# Patient Record
Sex: Female | Born: 1975 | Race: White | Hispanic: No | Smoking: Former smoker
Health system: Southern US, Community
[De-identification: ages and names within clinical notes are randomized; demographics above are authoritative.]

## PROBLEM LIST (undated history)

## (undated) DIAGNOSIS — F32A Depression, unspecified: Secondary | ICD-10-CM

## (undated) DIAGNOSIS — F1990 Other psychoactive substance use, unspecified, uncomplicated: Secondary | ICD-10-CM

## (undated) DIAGNOSIS — R011 Cardiac murmur, unspecified: Secondary | ICD-10-CM

## (undated) DIAGNOSIS — F1120 Opioid dependence, uncomplicated: Secondary | ICD-10-CM

## (undated) DIAGNOSIS — F329 Major depressive disorder, single episode, unspecified: Secondary | ICD-10-CM

## (undated) DIAGNOSIS — I10 Essential (primary) hypertension: Secondary | ICD-10-CM

## (undated) HISTORY — PX: TUBAL LIGATION: SHX77

---

## 2002-03-13 ENCOUNTER — Inpatient Hospital Stay (HOSPITAL_COMMUNITY): Admission: AD | Admit: 2002-03-13 | Discharge: 2002-03-13 | Payer: Self-pay | Admitting: *Deleted

## 2002-06-25 ENCOUNTER — Inpatient Hospital Stay (HOSPITAL_COMMUNITY): Admission: AD | Admit: 2002-06-25 | Discharge: 2002-06-25 | Payer: Self-pay | Admitting: *Deleted

## 2002-09-22 ENCOUNTER — Inpatient Hospital Stay (HOSPITAL_COMMUNITY): Admission: AD | Admit: 2002-09-22 | Discharge: 2002-09-22 | Payer: Self-pay | Admitting: Obstetrics

## 2002-10-13 ENCOUNTER — Inpatient Hospital Stay (HOSPITAL_COMMUNITY): Admission: AD | Admit: 2002-10-13 | Discharge: 2002-10-15 | Payer: Self-pay | Admitting: Obstetrics

## 2003-02-14 ENCOUNTER — Ambulatory Visit (HOSPITAL_COMMUNITY): Admission: RE | Admit: 2003-02-14 | Discharge: 2003-02-14 | Payer: Self-pay | Admitting: Gastroenterology

## 2003-02-20 ENCOUNTER — Ambulatory Visit (HOSPITAL_COMMUNITY): Admission: RE | Admit: 2003-02-20 | Discharge: 2003-02-20 | Payer: Self-pay | Admitting: Gastroenterology

## 2003-02-20 ENCOUNTER — Encounter: Payer: Self-pay | Admitting: Gastroenterology

## 2004-06-09 ENCOUNTER — Ambulatory Visit (HOSPITAL_COMMUNITY): Admission: RE | Admit: 2004-06-09 | Discharge: 2004-06-09 | Payer: Self-pay | Admitting: Obstetrics

## 2006-08-28 ENCOUNTER — Inpatient Hospital Stay (HOSPITAL_COMMUNITY): Admission: AD | Admit: 2006-08-28 | Discharge: 2006-08-31 | Payer: Self-pay | Admitting: Obstetrics

## 2006-12-16 ENCOUNTER — Ambulatory Visit (HOSPITAL_COMMUNITY): Admission: RE | Admit: 2006-12-16 | Discharge: 2006-12-16 | Payer: Self-pay | Admitting: Obstetrics

## 2009-10-21 ENCOUNTER — Emergency Department (HOSPITAL_BASED_OUTPATIENT_CLINIC_OR_DEPARTMENT_OTHER): Admission: EM | Admit: 2009-10-21 | Discharge: 2009-10-21 | Payer: Self-pay | Admitting: Emergency Medicine

## 2010-07-15 ENCOUNTER — Emergency Department (HOSPITAL_BASED_OUTPATIENT_CLINIC_OR_DEPARTMENT_OTHER): Admission: EM | Admit: 2010-07-15 | Discharge: 2010-07-15 | Payer: Self-pay | Admitting: Emergency Medicine

## 2011-03-27 NOTE — Op Note (Signed)
Margaret [name], Margaret [name]          ACCOUNT NO.:  [id number]   MEDICAL RECORD NO.:  [id number]          PATIENT TYPE:  AMB   LOCATION:  SDC                           FACILITY:  [facility]   PHYSICIAN:  Kathreen Cosier, M.D.DATE OF BIRTH:  1976/08/31   DATE OF PROCEDURE:  12/16/2006  DATE OF DISCHARGE:                               OPERATIVE REPORT   PREOPERATIVE DIAGNOSIS:  Multiparity.   PROCEDURE:  Laparoscopic tubal sterilization.   Under general anesthesia, with the patient in the lithotomy position,  abdomen prepped and the vagina prepped and draped and the bladder  emptied with a straight catheter.  A weighted speculum was placed in the  vagina.  The cervix was grasped with a Hulka tenaculum.  In the  umbilicus, a transverse incision was made and carried down to fascia.  The fascia was grasped with two Kochers, and the fascia and peritoneum  opened with Mayo scissors.  The tip of the trocar inserted  intraperitoneally.  Three liters of carbon dioxide was infused  intraperitoneally.  The scope was inserted, and the uterus, tubes and  ovaries were normal.  Cautery probe inserted and the right tube grasped  1 inch from the cornu and cauterized.  Tube cauterized in a total of  four places, moving lateral to the first site of cautery.  Proceeded  doing this in a similar fashion on the other side.  Portals were removed  from the abdomen, CO2 allowed to escape from the peritoneal cavity.  Fascia closed with one stitch of 0 Dexon.  The skin closed with  subcuticular stitch of 4-0 chromic.  The patient tolerated the procedure  well, taken to the recovery room in good condition.           ______________________________  Kathreen Cosier, M.D.     BAM/MEDQ  D:  12/16/2006  T:  12/16/2006  Job:  [id number]

## 2011-04-01 ENCOUNTER — Emergency Department (HOSPITAL_COMMUNITY)
Admission: EM | Admit: 2011-04-01 | Discharge: 2011-04-02 | Disposition: A | Discharge status: Other Acute Inpatient Hospital | Payer: Self-pay | Attending: Emergency Medicine | Admitting: Emergency Medicine

## 2011-04-01 DIAGNOSIS — F1910 Other psychoactive substance abuse, uncomplicated: Secondary | ICD-10-CM | POA: Insufficient documentation

## 2011-04-01 DIAGNOSIS — F1410 Cocaine abuse, uncomplicated: Secondary | ICD-10-CM | POA: Insufficient documentation

## 2011-04-01 DIAGNOSIS — F1210 Cannabis abuse, uncomplicated: Secondary | ICD-10-CM | POA: Insufficient documentation

## 2011-04-01 LAB — COMPREHENSIVE METABOLIC PANEL
ALT: 26 U/L (ref 0–35)
AST: 24 U/L (ref 0–37)
Albumin: 4.3 g/dL (ref 3.5–5.2)
Alkaline Phosphatase: 48 U/L (ref 39–117)
BUN: 10 mg/dL (ref 6–23)
CO2: 29 mEq/L (ref 19–32)
Calcium: 9 mg/dL (ref 8.4–10.5)
Chloride: 105 mEq/L (ref 96–112)
Creatinine, Ser: 0.7 mg/dL (ref 0.4–1.2)
GFR calc Af Amer: >60 mL/min (ref >60)
GFR calc non Af Amer: >60 mL/min (ref >60)
Glucose, Bld: 92 mg/dL (ref 70–99)
Potassium: 3.9 mEq/L (ref 3.5–5.1)
Sodium: 140 mEq/L (ref 135–145)
Total Bilirubin: 0.3 mg/dL (ref 0.3–1.2)
Total Protein: 6.6 g/dL (ref 6.0–8.3)

## 2011-04-01 LAB — DIFFERENTIAL
Basophils Absolute: 0.1 10*3/uL (ref 0.0–0.1)
Basophils Relative: 1 % (ref 0–1)
Eosinophils Absolute: 0.1 10*3/uL (ref 0.0–0.7)
Eosinophils Relative: 2 % (ref 0–5)
Lymphocytes Relative: 60 % — ABNORMAL HIGH (ref 12–46)
Lymphs Abs: 2.7 10*3/uL (ref 0.7–4.0)
Monocytes Absolute: 0.2 10*3/uL (ref 0.1–1.0)
Monocytes Relative: 5 % (ref 3–12)
Neutro Abs: 1.4 10*3/uL — ABNORMAL LOW (ref 1.7–7.7)
Neutrophils Relative %: 32 % — ABNORMAL LOW (ref 43–77)

## 2011-04-01 LAB — CBC
HCT: 39.1 % (ref 36.0–46.0)
Hemoglobin: 13 g/dL (ref 12.0–15.0)
MCH: 30.8 pg (ref 26.0–34.0)
MCHC: 33.2 g/dL (ref 30.0–36.0)
MCV: 92.7 fL (ref 78.0–100.0)
Platelets: 141 10*3/uL — ABNORMAL LOW (ref 150–400)
RBC: 4.22 MIL/uL (ref 3.87–5.11)
RDW: 11.7 % (ref 11.5–15.5)
WBC: 4.5 10*3/uL (ref 4.0–10.5)

## 2011-04-01 LAB — ETHANOL: Alcohol, Ethyl (B): <11 mg/dL — ABNORMAL HIGH (ref 0–10)

## 2011-04-01 LAB — PREGNANCY, URINE: Preg Test, Ur: NEGATIVE

## 2011-04-01 LAB — RAPID URINE DRUG SCREEN, HOSP PERFORMED
Benzodiazepines: NOT DETECTED
Cocaine: POSITIVE — AB
Opiates: POSITIVE — AB

## 2011-04-02 ENCOUNTER — Inpatient Hospital Stay (HOSPITAL_COMMUNITY)
Admission: AD | Admit: 2011-04-02 | Discharge: 2011-04-08 | DRG: 885 | Disposition: A | Discharge status: Home / Self Care | Payer: PRIVATE HEALTH INSURANCE | Source: Ambulatory Visit | Attending: Psychiatry | Admitting: Psychiatry

## 2011-04-02 DIAGNOSIS — F1920 Other psychoactive substance dependence, uncomplicated: Secondary | ICD-10-CM

## 2011-04-02 DIAGNOSIS — F4310 Post-traumatic stress disorder, unspecified: Secondary | ICD-10-CM

## 2011-04-02 DIAGNOSIS — Z5900 Homelessness unspecified: Secondary | ICD-10-CM

## 2011-04-02 DIAGNOSIS — F331 Major depressive disorder, recurrent, moderate: Principal | ICD-10-CM

## 2011-04-02 NOTE — Consult Note (Signed)
  Margaret [name], [name]          ACCOUNT NO.:  [id number]  MEDICAL RECORD NO.:  [id number]           PATIENT TYPE:  E  LOCATION:  WLED                         FACILITY:  [facility]  PHYSICIAN:  Eulogio Ditch, MD DATE OF BIRTH:  [birth year]  DATE OF CONSULTATION:  04/02/2011 DATE OF DISCHARGE:                                CONSULTATION   REASON FOR CONSULTATION:  Drug abuse.  HISTORY OF PRESENT ILLNESS:  A 35 year old female who is currently homeless; living with the friends; abusing all kind of drugs morphine, cocaine, Xanax, and marijuana for the last many years.  The patient currently is motivated to get detox and rehab.  She denies any suicidal or homicidal ideations.  She denies hearing any voices.  CURRENT PSYCHIATRIC MEDICATIONS:  The patient is not on any psych medication.  PAST MEDICAL HISTORY:  History of heart murmur.  ALLERGIES:  No known drug allergies.  MENTAL STATUS EXAMINATION:  Calm, cooperative during the interview. Fair eye contact.  No abnormal movements noticed.  Hygiene, grooming fair.  Speech normal in rate, rhythm, and volume.  Mood, depressed. Affect mood congruent.  Thought process, logical and goal directed. Thought content, not suicidal or homicidal, not delusional.  Thought perception, no audiovisual hallucination reported.  Not internally preoccupied.  Cognition, alert, awake, oriented x3.  Memory immediate, recent, remote is fair.  Attention concentration, fair.  Abstraction ability, good.  Insight and judgment, fair.  DIAGNOSES:  Axis I:  Polysubstance dependence. Axis II:  Deferred. Axis III:  History of heart murmur. Axis IV:  Homeless, drug abuse. Axis V:  40.  RECOMMENDATIONS: 1. The patient will be started on clonidine detox protocol. 2. The patient will be referred to the inpatient detox and rehab.     Eulogio Ditch, MD    SA/MEDQ  D:  04/02/2011  T:  04/02/2011  Job:  [id number]  Electronically Signed by Eulogio Ditch  on 04/02/2011 06:45:11 PM

## 2011-04-03 DIAGNOSIS — F1920 Other psychoactive substance dependence, uncomplicated: Secondary | ICD-10-CM

## 2011-04-03 DIAGNOSIS — F339 Major depressive disorder, recurrent, unspecified: Secondary | ICD-10-CM
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
## 2011-04-26 ENCOUNTER — Emergency Department (HOSPITAL_BASED_OUTPATIENT_CLINIC_OR_DEPARTMENT_OTHER)
Admission: EM | Admit: 2011-04-26 | Discharge: 2011-04-26 | Disposition: A | Discharge status: Home / Self Care | Payer: Self-pay | Attending: Emergency Medicine | Admitting: Emergency Medicine

## 2011-04-26 DIAGNOSIS — R5381 Other malaise: Secondary | ICD-10-CM | POA: Insufficient documentation

## 2011-04-26 DIAGNOSIS — R5383 Other fatigue: Secondary | ICD-10-CM | POA: Insufficient documentation

## 2011-04-26 DIAGNOSIS — Z79899 Other long term (current) drug therapy: Secondary | ICD-10-CM | POA: Insufficient documentation

## 2011-04-26 LAB — BASIC METABOLIC PANEL
BUN: 16 mg/dL (ref 6–23)
CO2: 27 mEq/L (ref 19–32)
Calcium: 10 mg/dL (ref 8.4–10.5)
Chloride: 104 mEq/L (ref 96–112)
Creatinine, Ser: 0.7 mg/dL (ref 0.50–1.10)
Glucose, Bld: 106 mg/dL — ABNORMAL HIGH (ref 70–99)

## 2011-12-03 ENCOUNTER — Encounter (HOSPITAL_BASED_OUTPATIENT_CLINIC_OR_DEPARTMENT_OTHER): Payer: Self-pay | Admitting: *Deleted

## 2011-12-03 ENCOUNTER — Emergency Department (HOSPITAL_BASED_OUTPATIENT_CLINIC_OR_DEPARTMENT_OTHER)
Admission: EM | Admit: 2011-12-03 | Discharge: 2011-12-03 | Disposition: A | Discharge status: Home / Self Care | Payer: Self-pay | Attending: Emergency Medicine | Admitting: Emergency Medicine

## 2011-12-03 DIAGNOSIS — M6530 Trigger finger, unspecified finger: Secondary | ICD-10-CM | POA: Insufficient documentation

## 2011-12-03 DIAGNOSIS — M79609 Pain in unspecified limb: Secondary | ICD-10-CM | POA: Insufficient documentation

## 2011-12-03 MED ORDER — NAPROXEN 500 MG PO TABS: 500.0000 mg | ORAL_TABLET | Freq: Two times a day (BID) | ORAL | Status: DC

## 2011-12-03 NOTE — ED Notes (Signed)
Pt reports that during the night for the past 2 months she wakes up and both hands are "alseep and painful" states in the mornings usually 2 fingers on each hand are"swollen and stuck" in a bent position and unable to straighten them. 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 No other health problems or symptoms. States during the day sometimes her fingers will"let go and open up" pt denies cramping in calves or other areas of body

## 2011-12-03 NOTE — ED Provider Notes (Signed)
History     CSN: [id number]  Arrival date & time 12/03/11  8756   First MD Initiated Contact with Patient 12/03/11 [phone]      Chief Complaint  Patient presents with  . Hand Pain    (Consider location/radiation/quality/duration/timing/severity/associated sxs/prior treatment) HPI Comments: 2 months of intermittent bilateral finger pain. The pain affect all fingers at various times except the bilateral little fingers.  Patient states it is worse when she wakes up and while she will have several fingers fixed in a bent position.  They're painful to straighten and require force from the other hand.  There is no tingling or numbness there is pain. It seems to get better throughout the day but they're quite stiff in the mornings. She's been taking ibuprofen for the pain. 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 She denies any fever, vomiting, rash, weakness.  The history is provided by the patient.    History reviewed. No pertinent past medical history.  History reviewed. No pertinent past surgical history.  History reviewed. No pertinent family history.  History  Substance Use Topics  . Smoking status: Former Games developer  . Smokeless tobacco: Not on file  . Alcohol Use: No    OB History    Grav Para Term Preterm Abortions TAB SAB Ect Mult Living                  Review of Systems  Constitutional: Negative for fever, activity change and appetite change.  Respiratory: Negative for choking and shortness of breath.   Cardiovascular: Negative for chest pain.  Gastrointestinal: Negative for nausea and vomiting.  Genitourinary: Negative for dysuria and hematuria.  Musculoskeletal: Positive for myalgias and arthralgias.  Skin: Negative for rash.    Allergies  Review of patient's allergies indicates no known allergies.  Home Medications   Current Outpatient Rx  Name Route Sig Dispense Refill  . METHADONE HCL 10 MG PO TABS Oral Take 10 mg by mouth every  8 (eight) hours.    Marland Kitchen NAPROXEN 500 MG PO TABS Oral Take 1 tablet (500 mg total) by mouth 2 (two) times daily. 30 tablet 0    BP 143/91  Pulse 81  Temp(Src) 98.2 F (36.8 C) (Oral)  Resp 20  SpO2 100%  LMP 10/03/2011  Physical Exam  Constitutional: She is oriented to person, place, and time. She appears well-developed and well-nourished. No distress.  HENT:  Head: Normocephalic and atraumatic.  Musculoskeletal: Normal range of motion. She exhibits no edema and no tenderness.       Right fourth finger flexed at the MCP and PIP joint. Able to be straightened.  +2 radial pulses bilaterally, cardinal hand movements intact, capillary refill less than 2 seconds, no rash sensation intact. Negative Tinel's sign negative Phalen sign  Neurological: She is alert and oriented to person, place, and time. No cranial nerve deficit.  Skin: Skin is warm.    ED Course  Procedures (including critical care time)  Labs Reviewed  GLUCOSE, CAPILLARY - Abnormal; Notable for the following:    Glucose-Capillary 110 (*)    All other components within normal limits  POCT CBG MONITORING   No results found.   1. Trigger finger       MDM  Bilateral finger pain for the past 2 months with varying fingers affected.  At least one trigger finger on exam.  No weakness, numbness, tingling.  Good perfusion.    Probable multiple trigger fingers, less likely  carpal tunnel syndrome. CBG, discussed use of wrist splints at night, NSAIDs, f/u hand.        Glynn Octave, MD 12/03/11 1650

## 2011-12-28 ENCOUNTER — Encounter (HOSPITAL_COMMUNITY): Payer: Self-pay

## 2011-12-28 ENCOUNTER — Emergency Department (HOSPITAL_COMMUNITY)
Admission: EM | Admit: 2011-12-28 | Discharge: 2011-12-30 | Disposition: A | Discharge status: Home / Self Care | Payer: Self-pay | Attending: Emergency Medicine | Admitting: Emergency Medicine

## 2011-12-28 DIAGNOSIS — F3289 Other specified depressive episodes: Secondary | ICD-10-CM | POA: Insufficient documentation

## 2011-12-28 DIAGNOSIS — F329 Major depressive disorder, single episode, unspecified: Secondary | ICD-10-CM

## 2011-12-28 DIAGNOSIS — R45851 Suicidal ideations: Secondary | ICD-10-CM | POA: Insufficient documentation

## 2011-12-28 LAB — RAPID URINE DRUG SCREEN, HOSP PERFORMED
Barbiturates: NOT DETECTED
Benzodiazepines: NOT DETECTED
Cocaine: POSITIVE — AB

## 2011-12-28 LAB — COMPREHENSIVE METABOLIC PANEL
ALT: 17 U/L (ref 0–35)
BUN: 9 mg/dL (ref 6–23)
CO2: 32 mEq/L (ref 19–32)
Calcium: 9.8 mg/dL (ref 8.4–10.5)
GFR calc Af Amer: >90 mL/min (ref >90)
GFR calc non Af Amer: >90 mL/min (ref >90)
Glucose, Bld: 97 mg/dL (ref 70–99)
Total Protein: 6.7 g/dL (ref 6.0–8.3)

## 2011-12-28 LAB — CBC
HCT: 36.3 % (ref 36.0–46.0)
Hemoglobin: 12.7 g/dL (ref 12.0–15.0)
MCH: 30.8 pg (ref 26.0–34.0)
MCHC: 35 g/dL (ref 30.0–36.0)
MCV: 87.9 fL (ref 78.0–100.0)
RBC: 4.13 MIL/uL (ref 3.87–5.11)

## 2011-12-28 LAB — ETHANOL: Alcohol, Ethyl (B): <11 mg/dL (ref 0–11)

## 2011-12-28 LAB — ACETAMINOPHEN LEVEL: Acetaminophen (Tylenol), Serum: <15 ug/mL (ref 10–30)

## 2011-12-28 MED ORDER — NICOTINE 21 MG/24HR TD PT24: 21.0000 mg | MEDICATED_PATCH | Freq: Every day | TRANSDERMAL | Status: DC

## 2011-12-28 MED ORDER — LORAZEPAM 1 MG PO TABS: 1.0000 mg | ORAL_TABLET | Freq: Three times a day (TID) | ORAL | Status: DC | PRN

## 2011-12-28 MED ORDER — ONDANSETRON HCL 4 MG PO TABS: 4.0000 mg | ORAL_TABLET | Freq: Three times a day (TID) | ORAL | Status: DC | PRN

## 2011-12-28 MED ORDER — IBUPROFEN 600 MG PO TABS
600.0000 mg | ORAL_TABLET | Freq: Three times a day (TID) | ORAL | Status: DC | PRN
  Administered 2011-12-29: 600 mg via ORAL
  Filled 2011-12-28: qty 1

## 2011-12-28 MED ORDER — BUPROPION HCL ER (XL) 150 MG PO TB24
150.0000 mg | ORAL_TABLET | Freq: Every day | ORAL | Status: DC
  Administered 2011-12-28 – 2011-12-29 (×2): 150 mg via ORAL
  Filled 2011-12-28 (×3): qty 1

## 2011-12-28 MED ORDER — ALUM & MAG HYDROXIDE-SIMETH 200-200-20 MG/5ML PO SUSP: 30.0000 mL | ORAL | Status: DC | PRN

## 2011-12-28 MED ORDER — BUSPIRONE HCL 10 MG PO TABS
5.0000 mg | ORAL_TABLET | Freq: Two times a day (BID) | ORAL | Status: DC
  Administered 2011-12-28 – 2011-12-29 (×3): 5 mg via ORAL
  Administered 2011-12-29: 10 mg via ORAL
  Filled 2011-12-28: qty 0.5
  Filled 2011-12-28 (×3): qty 1
  Filled 2011-12-28: qty 2
  Filled 2011-12-28: qty 1

## 2011-12-28 MED ORDER — ZOLPIDEM TARTRATE 5 MG PO TABS: 5.0000 mg | ORAL_TABLET | Freq: Every evening | ORAL | Status: DC | PRN

## 2011-12-28 MED ORDER — ACETAMINOPHEN 325 MG PO TABS: 650.0000 mg | ORAL_TABLET | ORAL | Status: DC | PRN
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
## 2011-12-28 NOTE — ED Notes (Signed)
Pt declined treatment at [facility] due to not wanting to detox from methadone at this time. Pt's information has been faxed to [facility]. There are no beds at [facility], [facility], [facility], and [facility]. [facility] currently has beds but does not have [facility].

## 2011-12-28 NOTE — ED Provider Notes (Signed)
History     CSN: [id number]  Arrival date & time 12/28/11  1034   First MD Initiated Contact with Patient 12/28/11 1044      Chief Complaint  Patient presents with  . Medical Clearance    (Consider location/radiation/quality/duration/timing/severity/associated sxs/prior treatment) HPI  35yoF h/o of polysubstance abuse, depression noncompliant with medications presents with suicidal ideation. The patient states that she's been feeling suicidal for quite some time. She states that it became more reality 2 days ago after her son was in a car accident. She states that he is hospitalized not do well at this time. She had plans to overdose on pills. She did not act on these thoughts. She denies Tylenol, ibuprofen, aspirin, drug ingestion. He denies alcohol. She states she has a history of suicide attempts with cutting when she was a teenager but no cutting recently. sHe also reports increased social stresses recently including addictive from her apartment. She states she last took cocaine this weekend. She was escorted to the emergency department by her counselors at [facility]. Denies homicidal ideation, auditory visual hallucinations.  She denies all other complaints at this time including headache, dizziness, chest pain, shortness of breath, abdominal pain, nausea, vomiting.  ED Notes, ED Provider Notes from 12/28/11 0000 to 12/28/11 10:55:15       Courtney Heys, RN 12/28/2011 10:54      Pt states that she has a 34 year old that she hasnt had custody of in years, he was in a car accident this weekend and is in [facility] hospital, pt goes to [facility] and is on methadone for drug rehab. Pt not allowed at her boyfriends parents house, they recently got kicked out of apartment unable to pay the rent. Pt states that she did some cocaine this weekend. Pt brought here by her counselors from [facility]    History reviewed. No pertinent past medical history.  History reviewed. No pertinent past surgical  history.  No family history on file.  History  Substance Use Topics  . Smoking status: Former Games developer  . Smokeless tobacco: Not on file  . Alcohol Use: No    OB History    Grav Para Term Preterm Abortions TAB SAB Ect Mult Living                  Review of Systems  All other systems reviewed and are negative.   except as noted HPI   Allergies  Review of patient's allergies indicates no known allergies.  Home Medications   Current Outpatient Rx  Name Route Sig Dispense Refill  . METHADONE HCL PO Oral Take 90 mg by mouth daily. Pt [facility].      BP 148/99  Pulse 80  Temp(Src) 98.8 F (37.1 C) (Oral)  Resp 18  SpO2 98%  LMP 10/03/2011  Physical Exam  Nursing note and vitals reviewed. Constitutional: She is oriented to person, place, and time. She appears well-developed.  HENT:  Head: Atraumatic.  Mouth/Throat: Oropharynx is clear and moist.  Eyes: Conjunctivae and EOM are normal. Pupils are equal, round, and reactive to light.  Neck: Normal range of motion. Neck supple.  Cardiovascular: Normal rate, regular rhythm, normal heart sounds and intact distal pulses.   Pulmonary/Chest: Effort normal and breath sounds normal. No respiratory distress. She has no wheezes. She has no rales.  Abdominal: Soft. She exhibits no distension. There is no tenderness. There is no rebound and no guarding.  Musculoskeletal: Normal range of  motion.  Neurological: She is alert and oriented to person, place, and time.  Skin: Skin is warm and dry. No rash noted.  Psychiatric:       Flat affect   ED Course  Procedures (including critical care time)  Labs Reviewed  CBC - Abnormal; Notable for the following:    Platelets 137 (*)    All other components within normal limits  COMPREHENSIVE METABOLIC PANEL - Abnormal; Notable for the following:    Total Bilirubin 0.2 (*)    All other components within normal limits  URINE RAPID DRUG SCREEN (HOSP  PERFORMED) - Abnormal; Notable for the following:    Cocaine POSITIVE (*)    Tetrahydrocannabinol POSITIVE (*)    All other components within normal limits  SALICYLATE LEVEL - Abnormal; Notable for the following:    Salicylate Lvl <2.0 (*)    All other components within normal limits  ETHANOL  ACETAMINOPHEN LEVEL   No results found.   1. Depression   2. Suicidal ideation    MDM   The patient has suicidal ideation. Worsening depression. She has been noncompliant with her medications for several months. She is requesting inpatient admission. Labs, ACT consult on arrival. Telepsych requested. Reassess.   Patient medically cleared. ACT working on placement.       Forbes Cellar, MD 12/28/11 1525

## 2011-12-28 NOTE — ED Notes (Signed)
Care assumed

## 2011-12-28 NOTE — ED Notes (Signed)
Pt states that she has a 34 year old that she hasnt had custody of in years, he was in a car accident this weekend and is in [facility] hospital, pt goes to [facility] and is on methadone for drug rehab. Pt not allowed at her boyfriends parents house, they recently got kicked out of apartment unable to pay the rent. Pt states that she did some cocaine this weekend. Pt brought here by her counselors from [facility]

## 2011-12-29 MED ORDER — METHADONE HCL 10 MG PO TABS
90.0000 mg | ORAL_TABLET | Freq: Every day | ORAL | Status: DC
  Administered 2011-12-29: 90 mg via ORAL
  Filled 2011-12-29: qty 9

## 2011-12-29 NOTE — Discharge Planning (Signed)
Called [facility] who advised they did not recv referral, but do not have beds at this time.  CSW faxed patient's information to [facility] and [facility]. Oncoming staff will follow up.  Ileene Hutchinson , MSW, LCSWA 12/29/2011 2:25 PM [phone]

## 2011-12-29 NOTE — [facility] Counselor (Signed)
Received a call from patients therapist Melody Hassell Done at [facility] # [phone]. Sts that they will take patient back at [facility] for treatment if she is detoxed from opiates. Melody also sts that they will also take her back if she is not detoxed from Methodone.  Patient sts that she is now willing to detox off the Methodone at [facility]. Writer contacted [employer] and made him aware that patient agrees to the detox treatment at [facility].

## 2011-12-29 NOTE — ED Notes (Signed)
CSW sent the pt's information to [facility] again for review. Pt continues to pend for disposition at [facility] and [facility]. CSW/ACT to continue following pt for placement.

## 2011-12-29 NOTE — ED Notes (Signed)
Pt has been declined at [facility] due to the pt being prescribed methadone. Pt is pending [facility] and [facility]. ACT/CSW to continue following for placement.

## 2011-12-29 NOTE — ED Notes (Signed)
Care assumed

## 2011-12-29 NOTE — [facility] Counselor (Signed)
Received a call from Dr. Kirtland Bouchard Reddy's office also known as [facility] today. Spoke to his nurse--Suzanne. She informs me that their are  concerns for Margaret [name]'s treatment. She further explains that patient has informed her that [facility] will not consider her for in-pt treatment unless she agrees to detox off of Methadone.   Writer explained to [place] that the details of her medical could not be discussed without pt's consent due to HIPPA. Lynnae Sandhoff sts that patient would like Dr. Betti Cruz to be involved in her care.  In order to discuss patients care over the phone writer informed patient that a nurse-Suzanne from Dr. Reddy's office wanted to discuss her care. Patient agreed and gave verbal consent to discuss her case. Patient was also present during conversation and able to verify [facility] as her nurse at Dr. Jae Dire office.   Writer made both individuals aware that patient was currently declined at [facility]. Reason for decline was due to patient not agreeing to a methodone detox. Lynnae Sandhoff explained that on behalf of Dr. Jae Dire office/[facility] that they would like to work with [facility] or any other facility where patient receives treatment by keeping patient on Methadone. Rosalita Chessman explains that they will take patient back at their facility following stabilization of her mental health issues (suicidal thoughts). Also, patient expressed that she has no desire to end her Methadone treatment and would like to continue.   Based on the above information writer is asking that [facility] re-consider patient for in-pt treatment. Writer has spoken to [facility] and made him aware of the above information. Pt is pending [facility] at this time. **Pt's information also referred to 3-way facilities including [facility] and [facility] for consideration of in-pt treatment.  Pt's is pending acceptance/denials to both facilities.

## 2011-12-29 NOTE — ED Provider Notes (Addendum)
  Physical Exam  BP 120/80  Pulse 68  Temp(Src) 98.1 F (36.7 C) (Oral)  Resp 18  SpO2 98%  LMP 10/03/2011  Physical Exam  ED Course  Procedures  MDM Patient is requesting her methadone. She states she is on 90 mg a day. She gets it through [facility]. [facility] was called her dose was verified. She'll be started back on the methadone. Still awaiting placement.      Margaret [name]. Rubin Payor, MD 12/29/11 1610  Total psych done. They recommended staying on the same medications and placement. BuSpar 5 mg twice a day Wellbutrin XL 150 mg daily. Methadone 90 mg daily  Margaret [name]. Rubin Payor, MD 12/29/11 1214

## 2011-12-30 MED ORDER — BUPROPION HCL 100 MG PO TABS: 100.0000 mg | ORAL_TABLET | Freq: Two times a day (BID) | ORAL | Status: DC

## 2011-12-30 NOTE — ED Notes (Signed)
Pt now feeling better - she has no SI and no depression - she cites the fact that her son was hospitalized after an MVC and had surgery as a cauase of depression and SI.  She now has talked to her son who is home with his father, walking on his leg and she feels much better - denies active depression or SI and has no c/o at this time - she is willing to sign no harm contract and to f/u with psych - I will refill her Wellbutrin.  Terri with Psych to assist in no harm contract.  Vida Roller, MD 12/30/11 [phone]

## 2011-12-30 NOTE — ED Notes (Signed)
Discharged to care of husbands parents

## 2011-12-30 NOTE — [facility] Counselor (Signed)
Per Dr. Eber Hong, pt is able to contract for safety and has a safe place to go(boyfriend's home).  Pt signed a no harm contract and will be d/c'd home.

## 2011-12-30 NOTE — Discharge Instructions (Signed)
Return to the ER for severe or worsening depression or suicidal thoughts - you have agreed to sign a no harm contract which  Means that you agree to call 911 or return to the hospital immediately if you develop worsening depression or suicidal thoughts - call your therapist / psychiatrist today to schedule follow up.  Restart your wellbutrin today.

## 2012-10-03 ENCOUNTER — Encounter (HOSPITAL_COMMUNITY): Payer: Self-pay | Admitting: *Deleted

## 2012-10-03 ENCOUNTER — Emergency Department (HOSPITAL_COMMUNITY)
Admission: EM | Admit: 2012-10-03 | Discharge: 2012-10-04 | Disposition: A | Discharge status: Other Acute Inpatient Hospital | Payer: Self-pay | Attending: Emergency Medicine | Admitting: Emergency Medicine

## 2012-10-03 DIAGNOSIS — F1110 Opioid abuse, uncomplicated: Secondary | ICD-10-CM | POA: Insufficient documentation

## 2012-10-03 DIAGNOSIS — Z8679 Personal history of other diseases of the circulatory system: Secondary | ICD-10-CM | POA: Insufficient documentation

## 2012-10-03 DIAGNOSIS — Z87891 Personal history of nicotine dependence: Secondary | ICD-10-CM | POA: Insufficient documentation

## 2012-10-03 DIAGNOSIS — I10 Essential (primary) hypertension: Secondary | ICD-10-CM | POA: Insufficient documentation

## 2012-10-03 HISTORY — DX: Other psychoactive substance use, unspecified, uncomplicated: F19.90

## 2012-10-03 HISTORY — DX: Cardiac murmur, unspecified: R01.1

## 2012-10-03 HISTORY — DX: Essential (primary) hypertension: I10

## 2012-10-03 LAB — COMPREHENSIVE METABOLIC PANEL
AST: 17 U/L (ref 0–37)
Albumin: 3.7 g/dL (ref 3.5–5.2)
Chloride: 104 mEq/L (ref 96–112)
Creatinine, Ser: 0.73 mg/dL (ref 0.50–1.10)
Total Bilirubin: 0.3 mg/dL (ref 0.3–1.2)
Total Protein: 6.5 g/dL (ref 6.0–8.3)

## 2012-10-03 LAB — CBC
MCV: 85.7 fL (ref 78.0–100.0)
Platelets: 180 10*3/uL (ref 150–400)
RDW: 11.7 % (ref 11.5–15.5)
WBC: 5.8 10*3/uL (ref 4.0–10.5)

## 2012-10-03 LAB — RAPID URINE DRUG SCREEN, HOSP PERFORMED
Amphetamines: NOT DETECTED
Benzodiazepines: POSITIVE — AB
Cocaine: POSITIVE — AB
Opiates: POSITIVE — AB

## 2012-10-03 LAB — POCT PREGNANCY, URINE: Preg Test, Ur: NEGATIVE

## 2012-10-03 LAB — SALICYLATE LEVEL: Salicylate Lvl: <2 mg/dL — ABNORMAL LOW (ref 2.8–20.0)

## 2012-10-03 LAB — ACETAMINOPHEN LEVEL: Acetaminophen (Tylenol), Serum: <15 ug/mL (ref 10–30)

## 2012-10-03 MED ORDER — ZOLPIDEM TARTRATE 5 MG PO TABS: 5.0000 mg | ORAL_TABLET | Freq: Every evening | ORAL | Status: DC | PRN

## 2012-10-03 MED ORDER — ONDANSETRON HCL 4 MG PO TABS: 4.0000 mg | ORAL_TABLET | Freq: Three times a day (TID) | ORAL | Status: DC | PRN

## 2012-10-03 MED ORDER — ACETAMINOPHEN 325 MG PO TABS: 650.0000 mg | ORAL_TABLET | ORAL | Status: DC | PRN

## 2012-10-03 MED ORDER — LORAZEPAM 1 MG PO TABS
1.0000 mg | ORAL_TABLET | Freq: Three times a day (TID) | ORAL | Status: DC | PRN
  Administered 2012-10-03: 1 mg via ORAL
  Filled 2012-10-03: qty 1

## 2012-10-03 NOTE — ED Notes (Signed)
Pt states she is scheduled to go to rehab on December 4th, has to see probation officer on 2nd, states she needs to go through detox from heroin and cocaine before then. Last time used heroin and cocaine was today.

## 2012-10-03 NOTE — BH Assessment (Addendum)
Assessment Note   Margaret [name] is a 36 y.o. female who presents to [facility] for detox.  Pt denies SI/HI/Psych.  Pt is using Heroin(snorting), 10 bags daily, last use was 10/03/12. Pt reports using 5 bags.  Pt using Cocaine, 1/2 gram daily, last use was 10/03/12, pt used 1/2 gram; Pt using Pain Pills(Vicodin), pt uses 2 pills bi-weekly.  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  Pt says she was told by probation officer(Ebony Raquel James) she needed to detox prior to entering rehab.  Pt has no current legal charges and is on probation for Possession of Heroin/Cocaine.  Pt has no current w/d sxs, no issues with seizures/blackouts.  Pt has previous inpt admissions with [facility], [facility], [facility] and [facility] for detox/rehab.  This Clinical research associate contacted for avail beds: per [place], no female beds.  Assessment to be reviewed by [facility] for inpt admission.  Pt also using THC, wkly, uses 1 blunt, last use was 10/02/12.  Axis I: Polysub Dep; Depressive D/O  Axis II: Deferred Axis III:  Past Medical History  Diagnosis Date  . Drug use   . Hypertension   . Heart murmur    Axis IV: other psychosocial or environmental problems, problems related to legal system/crime, problems related to social environment and problems with primary support group Axis V: 41-50 serious symptoms  Past Medical History:  Past Medical History  Diagnosis Date  . Drug use   . Hypertension   . Heart murmur     Past Surgical History  Procedure Date  . Tubal ligation     Family History: History reviewed. No pertinent family history.  Social History:  reports that she has quit smoking. She has never used smokeless tobacco. She reports that she uses illicit drugs (Cocaine, Heroin, and Other-see comments). She reports that she does not drink alcohol.  Additional Social History:  Alcohol / Drug Use Pain Medications: None  Prescriptions: None  Over the Counter: None  History of alcohol  / drug use?: Yes Longest period of sobriety (when/how long): Unk  Negative Consequences of Use: Legal;Personal relationships Withdrawal Symptoms: Other (Comment) (No current w/d sxs ) Substance #1 Name of Substance 1: Heroin  1 - Age of First Use: 20 YOF  1 - Amount (size/oz): 10 Bags  1 - Frequency: Daily  1 - Duration: On-going  1 - Last Use / Amount: 10/03/12 Substance #2 Name of Substance 2: Cocaine  2 - Age of First Use: 20 YOF  2 - Amount (size/oz): 1/2 Gram  2 - Frequency: Daily  2 - Duration: On-going  2 - Last Use / Amount: 10/03/12 Substance #3 Name of Substance 3: Pain Pills--Vicodin  3 - Age of First Use: 20 YOF  3 - Amount (size/oz): 2 Pills  3 - Frequency: Bi-weekly  3 - Duration: On-going  3 - Last Use / Amount: 1 Wk Ago   CIWA: CIWA-Ar BP: 109/78 mmHg Pulse Rate: 67  COWS: Clinical Opiate Withdrawal Scale (COWS) Resting Pulse Rate: Pulse Rate 80 or below Sweating: No report of chills or flushing Restlessness: Able to sit still Pupil Size: Pupils pinned or normal size for room light Bone or Joint Aches: Not present Runny Nose or Tearing: Not present GI Upset: No GI symptoms Tremor: No tremor Yawning: No yawning Anxiety or Irritability: None Gooseflesh Skin: Skin is smooth COWS Total Score: 0   Allergies: No Known Allergies  Home Medications:  (Not in a  hospital admission)  OB/GYN Status:  No LMP recorded.  General Assessment Data Location of Assessment: [facility] Living Arrangements: Alone Can pt return to current living arrangement?: Yes Admission Status: Voluntary Is patient capable of signing voluntary admission?: Yes Transfer from: Acute Hospital Referral Source: MD  Education Status Is patient currently in school?: No Current Grade: None  Highest grade of school patient has completed: None  Name of school: None  Contact person: None   Risk to self Suicidal Ideation: No Suicidal Intent: No Is patient at risk for suicide?:  No Suicidal Plan?: No-Not Currently/Within Last 6 Months Access to Means: No What has been your use of drugs/alcohol within the last 12 months?: Abusing: Heroin, Cocaine, Pain Pills  Previous Attempts/Gestures: No How many times?: 0  Other Self Harm Risks: None  Triggers for Past Attempts: Unpredictable Intentional Self Injurious Behavior: None Family Suicide History: No Recent stressful life event(s): Other (Comment);Legal Issues (Chronic SA) Persecutory voices/beliefs?: No Depression: Yes Depression Symptoms: Loss of interest in usual pleasures;Feeling worthless/self pity Substance abuse history and/or treatment for substance abuse?: Yes Suicide prevention information given to non-admitted patients: Not applicable  Risk to Others Homicidal Ideation: No Thoughts of Harm to Others: No Current Homicidal Intent: No Current Homicidal Plan: No Access to Homicidal Means: No Identified Victim: None  History of harm to others?: No Assessment of Violence: None Noted Violent Behavior Description: None  Does patient have access to weapons?: No Criminal Charges Pending?: No Does patient have a court date: No  Psychosis Hallucinations: None noted Delusions: None noted  Mental Status Report Appear/Hygiene: Disheveled Eye Contact: Poor Motor Activity: Unremarkable Speech: Logical/coherent Level of Consciousness: Drowsy Mood: Depressed Affect: Appropriate to circumstance;Depressed Anxiety Level: None Thought Processes: Coherent;Relevant Judgement: Unimpaired Orientation: Person;Place;Time;Situation Obsessive Compulsive Thoughts/Behaviors: None  Cognitive Functioning Concentration: Normal Memory: Recent Intact;Remote Intact IQ: Average Insight: Fair Impulse Control: Fair Appetite: Good Weight Loss: 0  Weight Gain: 0  Sleep: No Change Total Hours of Sleep: 6  Vegetative Symptoms: None  ADLScreening [facility]) Patient's cognitive ability adequate to safely  complete daily activities?: Yes Patient able to express need for assistance with ADLs?: Yes Independently performs ADLs?: Yes (appropriate for developmental age)  Abuse/Neglect [facility]) Physical Abuse: Denies Verbal Abuse: Denies Sexual Abuse: Denies  Prior Inpatient Therapy Prior Inpatient Therapy: Yes Prior Therapy Dates: 2012, 2013 Prior Therapy Facilty/Provider(s): [facility], [facility], [facility], [facility]  Reason for Treatment: Detox/Rehab   Prior Outpatient Therapy Prior Outpatient Therapy: No Prior Therapy Dates: None  Prior Therapy Facilty/Provider(s): None  Reason for Treatment: None   ADL Screening (condition at time of admission) Patient's cognitive ability adequate to safely complete daily activities?: Yes Patient able to express need for assistance with ADLs?: Yes Independently performs ADLs?: Yes (appropriate for developmental age) Weakness of Legs: None Weakness of Arms/Hands: None  Home Assistive Devices/Equipment Home Assistive Devices/Equipment: None  Therapy Consults (therapy consults require a physician order) PT Evaluation Needed: No OT Evalulation Needed: No SLP Evaluation Needed: No Abuse/Neglect Assessment (Assessment to be complete while patient is alone) Physical Abuse: Denies Verbal Abuse: Denies Sexual Abuse: Denies Exploitation of patient/patient's resources: Denies Self-Neglect: Denies Values / Beliefs Cultural Requests During Hospitalization: None Spiritual Requests During Hospitalization: None Consults Spiritual Care Consult Needed: No Social Work Consult Needed: No Merchant navy officer (For Healthcare) Advance Directive: Patient does not have advance directive;Patient would not like information Pre-existing out of facility DNR order (yellow form or pink MOST form): No Nutrition Screen- [facility] Patient's home diet: Regular  Have you recently lost weight without trying?: No Have you been eating poorly because of a decreased appetite?:  No Malnutrition Screening Tool Score: 0   Additional Information 1:1 In Past 12 Months?: No CIRT Risk: No Elopement Risk: No Does patient have medical clearance?: Yes     Disposition:  Disposition Disposition of Patient: Inpatient treatment program;Referred to [facility], [facility]) Type of inpatient treatment program: Adult Patient referred to: [facility];Other (Comment) [facility] )  On Site Evaluation by:   Reviewed with Physician:     Murrell Redden 10/03/2012 11:40 PM

## 2012-10-03 NOTE — ED Notes (Signed)
Pt transferred to psych ED from triage requesting detox from heroin. States she is supposed to go to [facility] for rehab on Dec. 4 but has to be clean due to probation.  States she's been using opiates for about 10 years and has been using 10 dime bags daily, injecting and snorting.  Pt denies SI/HI or A/VH. Oriented to unit.

## 2012-10-04 ENCOUNTER — Inpatient Hospital Stay (HOSPITAL_COMMUNITY)
Admission: EM | Admit: 2012-10-04 | Discharge: 2012-10-07 | DRG: 897 | Disposition: A | Discharge status: Home / Self Care | Payer: Federal, State, Local not specified - Other | Source: Ambulatory Visit | Attending: Psychiatry | Admitting: Psychiatry

## 2012-10-04 ENCOUNTER — Encounter (HOSPITAL_COMMUNITY): Payer: Self-pay | Admitting: *Deleted

## 2012-10-04 DIAGNOSIS — F1120 Opioid dependence, uncomplicated: Principal | ICD-10-CM

## 2012-10-04 DIAGNOSIS — Z79899 Other long term (current) drug therapy: Secondary | ICD-10-CM

## 2012-10-04 DIAGNOSIS — F1420 Cocaine dependence, uncomplicated: Secondary | ICD-10-CM

## 2012-10-04 DIAGNOSIS — F1193 Opioid use, unspecified with withdrawal: Secondary | ICD-10-CM | POA: Diagnosis present

## 2012-10-04 DIAGNOSIS — F1410 Cocaine abuse, uncomplicated: Secondary | ICD-10-CM | POA: Diagnosis present

## 2012-10-04 DIAGNOSIS — I10 Essential (primary) hypertension: Secondary | ICD-10-CM | POA: Diagnosis present

## 2012-10-04 DIAGNOSIS — F329 Major depressive disorder, single episode, unspecified: Secondary | ICD-10-CM

## 2012-10-04 DIAGNOSIS — F1123 Opioid dependence with withdrawal: Secondary | ICD-10-CM | POA: Diagnosis present

## 2012-10-04 MED ORDER — ALUM & MAG HYDROXIDE-SIMETH 200-200-20 MG/5ML PO SUSP: 30.0000 mL | ORAL | Status: DC | PRN

## 2012-10-04 MED ORDER — CLONIDINE HCL 0.1 MG PO TABS
0.1000 mg | ORAL_TABLET | Freq: Four times a day (QID) | ORAL | Status: AC
  Administered 2012-10-04 – 2012-10-05 (×5): 0.1 mg via ORAL
  Filled 2012-10-04 (×9): qty 1

## 2012-10-04 MED ORDER — TRAZODONE HCL 50 MG PO TABS
50.0000 mg | ORAL_TABLET | Freq: Every evening | ORAL | Status: DC | PRN
  Administered 2012-10-05 – 2012-10-06 (×2): 50 mg via ORAL
  Filled 2012-10-04: qty 28
  Filled 2012-10-04 (×2): qty 1
  Filled 2012-10-04: qty 28
  Filled 2012-10-04 (×6): qty 1

## 2012-10-04 MED ORDER — ONDANSETRON 4 MG PO TBDP
4.0000 mg | ORAL_TABLET | Freq: Four times a day (QID) | ORAL | Status: DC | PRN
  Administered 2012-10-04 – 2012-10-05 (×2): 4 mg via ORAL
  Filled 2012-10-04 (×2): qty 1

## 2012-10-04 MED ORDER — DICYCLOMINE HCL 20 MG PO TABS
20.0000 mg | ORAL_TABLET | Freq: Four times a day (QID) | ORAL | Status: DC | PRN
  Administered 2012-10-04 – 2012-10-06 (×5): 20 mg via ORAL
  Filled 2012-10-04 (×5): qty 1

## 2012-10-04 MED ORDER — CHLORDIAZEPOXIDE HCL 25 MG PO CAPS
25.0000 mg | ORAL_CAPSULE | Freq: Four times a day (QID) | ORAL | Status: DC | PRN
  Administered 2012-10-04 (×2): 25 mg via ORAL
  Filled 2012-10-04 (×2): qty 1

## 2012-10-04 MED ORDER — CLONIDINE HCL 0.1 MG PO TABS
0.1000 mg | ORAL_TABLET | ORAL | Status: DC
  Administered 2012-10-06 – 2012-10-07 (×3): 0.1 mg via ORAL
  Filled 2012-10-04 (×4): qty 1

## 2012-10-04 MED ORDER — LOPERAMIDE HCL 2 MG PO CAPS
2.0000 mg | ORAL_CAPSULE | ORAL | Status: DC | PRN
  Administered 2012-10-05 – 2012-10-06 (×2): 2 mg via ORAL

## 2012-10-04 MED ORDER — LORAZEPAM 1 MG PO TABS
1.0000 mg | ORAL_TABLET | ORAL | Status: DC | PRN
  Administered 2012-10-04 – 2012-10-06 (×6): 1 mg via ORAL
  Filled 2012-10-04 (×6): qty 1

## 2012-10-04 MED ORDER — GABAPENTIN 300 MG PO CAPS
300.0000 mg | ORAL_CAPSULE | Freq: Three times a day (TID) | ORAL | Status: DC
  Administered 2012-10-04 – 2012-10-07 (×9): 300 mg via ORAL
  Filled 2012-10-04: qty 1
  Filled 2012-10-04: qty 42
  Filled 2012-10-04 (×4): qty 1
  Filled 2012-10-04: qty 42
  Filled 2012-10-04 (×8): qty 1
  Filled 2012-10-04: qty 42
  Filled 2012-10-04: qty 1

## 2012-10-04 MED ORDER — HYDROXYZINE HCL 25 MG PO TABS
25.0000 mg | ORAL_TABLET | Freq: Four times a day (QID) | ORAL | Status: DC | PRN
  Administered 2012-10-04 – 2012-10-06 (×3): 25 mg via ORAL
  Filled 2012-10-04: qty 1

## 2012-10-04 MED ORDER — MAGNESIUM HYDROXIDE 400 MG/5ML PO SUSP: 30.0000 mL | Freq: Every day | ORAL | Status: DC | PRN

## 2012-10-04 MED ORDER — NICOTINE 21 MG/24HR TD PT24
21.0000 mg | MEDICATED_PATCH | Freq: Every day | TRANSDERMAL | Status: DC
  Filled 2012-10-04 (×3): qty 1

## 2012-10-04 MED ORDER — METHOCARBAMOL 500 MG PO TABS
500.0000 mg | ORAL_TABLET | Freq: Three times a day (TID) | ORAL | Status: DC | PRN
  Administered 2012-10-04 – 2012-10-05 (×3): 500 mg via ORAL
  Filled 2012-10-04 (×3): qty 1

## 2012-10-04 MED ORDER — CLONIDINE HCL 0.1 MG PO TABS
0.1000 mg | ORAL_TABLET | Freq: Every day | ORAL | Status: DC
  Filled 2012-10-04 (×2): qty 1

## 2012-10-04 MED ORDER — NAPROXEN 500 MG PO TABS
500.0000 mg | ORAL_TABLET | Freq: Two times a day (BID) | ORAL | Status: DC | PRN
  Administered 2012-10-05: 500 mg via ORAL
  Filled 2012-10-04: qty 1

## 2012-10-04 MED ORDER — ACETAMINOPHEN 325 MG PO TABS: 650.0000 mg | ORAL_TABLET | Freq: Four times a day (QID) | ORAL | Status: DC | PRN

## 2012-10-04 NOTE — ED Provider Notes (Signed)
History     CSN: [id number]  Arrival date & time 10/03/12  1649   First MD Initiated Contact with Patient 10/03/12 1802      Chief Complaint  Patient presents with  . Medical Clearance    (Consider location/radiation/quality/duration/timing/severity/associated sxs/prior treatment) Patient is a 36 y.o. female presenting with drug problem. The history is provided by the patient.  Drug Problem Pertinent negatives include no chills or fever. Associated symptoms comments: She is here for assistance with detoxing from heroin use. Last use was this morning. No SI/HI or hallucinations. She is feeling physically well. She intends to check into a longterm treatment facility and is requiring detox prior to checking in..    Past Medical History  Diagnosis Date  . Drug use   . Hypertension   . Heart murmur     Past Surgical History  Procedure Date  . Tubal ligation     History reviewed. No pertinent family history.  History  Substance Use Topics  . Smoking status: Former Games developer  . Smokeless tobacco: Never Used  . Alcohol Use: No     Comment: Pain Pills--Vicodin     OB History    Grav Para Term Preterm Abortions TAB SAB Ect Mult Living                  Review of Systems  Constitutional: Negative for fever and chills.  HENT: Negative.   Respiratory: Negative.   Cardiovascular: Negative.   Gastrointestinal: Negative.   Musculoskeletal: Negative.   Skin: Negative.   Neurological: Negative.   Psychiatric/Behavioral: Positive for dysphoric mood.    Allergies  Review of patient's allergies indicates no known allergies.  Home Medications  No current outpatient prescriptions on file.  BP 117/80  Pulse 55  Temp 98.2 F (36.8 C) (Oral)  Resp 18  SpO2 99%  Physical Exam  Constitutional: She is oriented to person, place, and time. She appears well-developed and well-nourished. No distress.  HENT:  Head: Normocephalic.  Mouth/Throat: Oropharynx is clear and moist.    Neck: Normal range of motion. Neck supple.  Cardiovascular: Normal rate and regular rhythm.   No murmur heard. Pulmonary/Chest: Effort normal and breath sounds normal. She has no wheezes. She has no rales.  Abdominal: Soft. Bowel sounds are normal. There is no tenderness. There is no rebound and no guarding.  Musculoskeletal: Normal range of motion.       No obvious injection or "track" marks on upper extremities.   Neurological: She is alert and oriented to person, place, and time.  Skin: Skin is warm and dry. No rash noted.  Psychiatric: Her speech is normal and behavior is normal. Thought content normal. She exhibits a depressed mood.    ED Course  Procedures (including critical care time)  Labs Reviewed  SALICYLATE LEVEL - Abnormal; Notable for the following:    Salicylate Lvl <2.0 (*)     All other components within normal limits  URINE RAPID DRUG SCREEN (HOSP PERFORMED) - Abnormal; Notable for the following:    Opiates POSITIVE (*)     Cocaine POSITIVE (*)     Benzodiazepines POSITIVE (*)     Tetrahydrocannabinol POSITIVE (*)     All other components within normal limits  ACETAMINOPHEN LEVEL  CBC  COMPREHENSIVE METABOLIC PANEL  ETHANOL  POCT PREGNANCY, URINE   No results found.   1. Heroin abuse       MDM  Patient moved to psych area for assessment and disposition.  Rodena Medin, PA-C 10/04/12 1520

## 2012-10-04 NOTE — Progress Notes (Signed)
Pt observed in her bed dozing.  Pt easily responded to her name.  She reports she is still shaky and having abd cramps.  She describes her discomfort as "skin crawling".  Discussed prn meds with patient and what was available for her symptoms.  Pt voiced understanding.  Pt requested that she be allowed to stay in bed instead of going to AA meeting as she feels bad and would not be able to pay attention anyway.  Pt denies SI/HI/AV.  Support/encouragement given.  Encouraged to make her needs known to staff.  Safety maintained with q15 minute checks.

## 2012-10-04 NOTE — Clinical Social Work Note (Signed)
Aftercare Planning Group: 10/04/2012 9:45 AM  Pt did not attend d/c planning group on this date.  CSW met with pt individually at this time.  Pt  States that she is in a lot of pain today from withdrawal and rather just be home.  Pt states that she and her boyfriend brought her here for detox off of heroin.  Pt was tearful due to pain.  CSW will assess for appropriate referrals.    [facility] Group Note : Clinical Social Worker Group Therapy  10/04/2012  1:15 PM  Type of Therapy:  Group Therapy - Process Group  Participation Level:  Did Not Attend group on feelings towards diagnosis.     Aydden Cumpian Horton, LCSWA 10/04/2012 3:00 pm

## 2012-10-04 NOTE — ED Notes (Signed)
Patient discharge via ambulatory with steady gait. Respirations equal and unlabored. Skin warm and Dry. NAD.

## 2012-10-04 NOTE — Progress Notes (Signed)
D-Patient admitted this morning for detox from heroin and cocaine. Reports poor sleep last night from late admission. Per patient's self inventory has low energy level and poor ability to pay attention. Rates depression at 7 and feeling hopeless at 7. Reports withdrawal symptoms of cravings, agitation, chilling, and tremors. Denies SI.  A-Patient is currently on the clonidine detox protocol and has received several prn medications today to help manage her symptoms. She reports that her anxiety is at a level ten. Seen by Dr. Dub Mikes today and was started on neurontin to help mood symptoms.  R-Patient will be eligible for long term treatment at [facility] when detox has been completed. Patient is receptive to support and encouragement to reach goals. Remains safe on the unit.
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
## 2012-10-04 NOTE — Tx Team (Signed)
Initial Interdisciplinary Treatment Plan  PATIENT STRENGTHS: (choose at least two) Capable of independent living General fund of knowledge Motivation to enter rehab  PATIENT STRESSORS: Financial difficulties Legal issue Substance abuse   PROBLEM LIST: Problem List/Patient Goals Date to be addressed Date deferred Reason deferred Estimated date of resolution  Ineffective individual coping      Polysubstance abuse      Weak social support Information systems manager issues                                     DISCHARGE CRITERIA:  Improved stabilization in mood, thinking, and/or behavior Verbal commitment to aftercare and medication compliance Withdrawal symptoms are absent or subacute and managed without 24-hour nursing intervention  PRELIMINARY DISCHARGE PLAN: Attend 12-step recovery group Referrals indicated:  long-term rehab program  PATIENT/FAMIILY INVOLVEMENT: This treatment plan has been presented to and reviewed with the patient, Margaret [name].  The patient and family have been given the opportunity to ask questions and make suggestions.  Dion Saucier M 10/04/2012, 6:07 AM

## 2012-10-04 NOTE — ED Provider Notes (Signed)
Medical screening examination/treatment/procedure(s) were performed by non-physician practitioner and as supervising physician I was immediately available for consultation/collaboration.   Gerhard Munch, MD 10/04/12 [phone]
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
## 2012-10-04 NOTE — [facility] Suicide Risk Assessment (Signed)
Suicide Risk Assessment  Admission Assessment     Nursing information obtained from:    Demographic factors:    Current Mental Status:    Loss Factors:    Historical Factors:    Risk Reduction Factors:     CLINICAL FACTORS:   Depression:   Comorbid alcohol abuse/dependence Insomnia Alcohol/Substance Abuse/Dependencies  COGNITIVE FEATURES THAT CONTRIBUTE TO RISK:  No evidence  SUICIDE RISK:   Mild:  Suicidal ideation of limited frequency, intensity, duration, and specificity.  There are no identifiable plans, no associated intent, mild dysphoria and related symptoms, good self-control (both objective and subjective assessment), few other risk factors, and identifiable protective factors, including available and accessible social support.  PLAN OF CARE: Detox, supportive approach/coping skills/relapse prevention                              Reassess for need of an antidepressant   Samnang Shugars A 10/04/2012, 1:39 PM

## 2012-10-04 NOTE — Progress Notes (Signed)
Psychoeducational Group Note  Date:  10/04/2012 Time:  1100  Group Topic/Focus:  Recovery Goals:   The focus of this group is to identify appropriate goals for recovery and establish a plan to achieve them.  Participation Level:  Did not attend    Meredith Staggers 10/04/2012, 2:09 PM

## 2012-10-05 DIAGNOSIS — F1410 Cocaine abuse, uncomplicated: Secondary | ICD-10-CM

## 2012-10-05 NOTE — Clinical Social Work Note (Signed)
Aftercare Planning Group: 10/05/2012 9:45 AM  Pt attended discharge planning group and actively participated in group.  CSW provided pt with today's workbook.  Pt presents with flat affect and depressed mood.  Pt rates depression at a 6 and anxiety at a 10 today.  Pt denies SI/HI.  Pt states that she is detoxing off of heroin and cocaine.  Pt states that she is on probation for possession charges and has already arranged with her probation officer to go to [facility] on 12/4.  CSW will verify pt's follow up plans today.  No further needs voiced by pt at this time.    [facility] Group Note : Clinical Social Worker Group Therapy  10/05/2012  1:15 PM  Type of Therapy:  Group Therapy - Process Group  Participation Level:  Appropriate  Participation Quality:  Appropriate   Affect:  Angry and Appropriate  Cognitive:  Alert  Insight:  Good  Engagement in Group:  Good  Engagement in Therapy:  Good  Modes of Intervention:  Clarification, Education, Problem-solving, Socialization and Support  Summary of Progress/Problems: The topic for group today was emotional regulation.  Pt participated in the discussion regarding what emotional regulation is and how it affects their life, positive and negative.  Pt discussed coping skills and ways they can regulate their emotions in a positive manner.   Pt shared her anger towards her probation officer and how she feels that she has no control over her life right now.  Pt processed ways she can take back control by controlling her behaviors and actions.     Luther Newhouse Horton, LCSWA 10/05/2012 3:00 pm

## 2012-10-05 NOTE — Tx Team (Signed)
Interdisciplinary Treatment Plan Update (Adult)  Date:  10/05/2012  Time Reviewed:  9:40 AM   Progress in Treatment: Attending groups: Yes Participating in groups:  Yes Taking medication as prescribed: Yes Tolerating medication:  Yes Family/Significant othe contact made:  No, N/A Patient understands diagnosis:  Yes Discussing patient identified problems/goals with staff:  Yes Medical problems stabilized or resolved:  Yes Denies suicidal/homicidal ideation: Yes Issues/concerns per patient self-inventory:  None identified Other: N/A  New problem(s) identified: None Identified  Reason for Continuation of Hospitalization: Anxiety Depression Medication stabilization Withdrawal Symptoms  Interventions implemented related to continuation of hospitalization: mood stabilization, medication monitoring and adjustment, group therapy and psycho education, suicide risk assessment, collateral contact, aftercare planning, ongoing physician assessments and safety checks q 15 mins  Additional comments: N/A  Estimated length of stay: 3-5 days  Discharge Plan: Pt has already arranged to go to [facility] which was court ordered.  CSW will verify this today.      New goal(s): N/A  Review of initial/current patient goals per problem list:    1.  Goal (s): Address substance use by completing detox protocol  Met:  No  Target date: 12/2  As evidenced by: Completes detox on 12/2  2.  Goal (s): Reduce depressive symptoms from a 10 to a 3  Met:  No  Target date: 3-5 days  As evidenced by: Pt rates at a 6 today.    3.  Goal (s): Reduce anxiety symptoms from a 10 to a 3  Met:  No  Target date:  3-5 days  As evidenced by: Pt rates at a 10 today.     Attendees: Patient:     Family:     Physician: Geoffery Lyons, MD 10/05/2012 9:40 AM   Nursing: Roswell Miners, RN 10/05/2012 9:40 AM   Clinical Social Worker:  Reyes Ivan, LCSWA 10/05/2012  9:40 AM   Other: Nanine Means, NP 10/05/2012  9:40 AM   Other:  Verna Czech, LCSW 10/05/2012 9:44 AM   Other:  Bubba Camp, Psyc intern 10/05/2012 9:44 AM   Other:     Other:      Scribe for Treatment Team:   Reyes Ivan 10/05/2012 9:40 AM

## 2012-10-05 NOTE — Progress Notes (Signed)
[facility] MD Progress Note  10/05/2012 3:14 PM Margaret [name]  MRN:  [id number]  Diagnosis:  Opioid Dependence, Cocaine Abuse (R/O Dependence)   ADL's:  Intact  Sleep: Fair  Appetite:  Poor  Suicidal Ideation:  Plan:  Denies Intent:  Denies Means:  Denies Homicidal Ideation:  Plan:  Denies Intent:  Denies Means:  Denies  AEB (as evidenced by):  Mental Status Examination/Evaluation: Objective:  Appearance: Disheveled  Eye Contact::  Fair  Speech:  Clear and Coherent  Volume:  Decreased  Mood:  Depressed  Affect:  Restricted  Thought Process:  Coherent and Goal Directed  Orientation:  Full  Thought Content:  Symptoms, experience with probation officer, projects, rationalizes  Suicidal Thoughts:  No  Homicidal Thoughts:  No  Memory:  Immediate;   Fair Recent;   Fair Remote;   Fair  Judgement:  Intact  Insight:  Fair  Psychomotor Activity:  Decreased  Concentration:  Fair  Recall:  Fair  Akathisia:  No  Handed:  Right  AIMS (if indicated):     Assets:  Desire for Improvement Others:  Wants to be on her kids lifes  Sleep:  Number of Hours: 5.5    Vital Signs:Blood pressure 118/84, pulse 86, temperature 97.6 F (36.4 C), temperature source Oral, resp. rate 16, height 5\' 1"  (1.549 m), weight 69.854 kg (154 lb). Current Medications: Current Facility-Administered Medications  Medication Dose Route Frequency Provider Last Rate Last Dose  . acetaminophen (TYLENOL) tablet 650 mg  650 mg Oral Q6H PRN Kerry Hough, PA      . alum & mag hydroxide-simeth (MAALOX/MYLANTA) 200-200-20 MG/5ML suspension 30 mL  30 mL Oral Q4H PRN Kerry Hough, PA      . cloNIDine (CATAPRES) tablet 0.1 mg  0.1 mg Oral QID Kerry Hough, PA   0.1 mg at 10/05/12 1113   Followed by  . cloNIDine (CATAPRES) tablet 0.1 mg  0.1 mg Oral BH-qamhs Kerry Hough, PA       Followed by  . cloNIDine (CATAPRES) tablet 0.1 mg  0.1 mg Oral QAC breakfast Kerry Hough, PA      . dicyclomine  (BENTYL) tablet 20 mg  20 mg Oral Q6H PRN Kerry Hough, PA   20 mg at 10/05/12 0448  . gabapentin (NEURONTIN) capsule 300 mg  300 mg Oral TID Nanine Means, NP   300 mg at 10/05/12 1110  . hydrOXYzine (ATARAX/VISTARIL) tablet 25 mg  25 mg Oral Q6H PRN Kerry Hough, PA   25 mg at 10/05/12 0349  . loperamide (IMODIUM) capsule 2-4 mg  2-4 mg Oral PRN Kerry Hough, PA      . LORazepam (ATIVAN) tablet 1 mg  1 mg Oral Q4H PRN Nanine Means, NP   1 mg at 10/05/12 0448  . magnesium hydroxide (MILK OF MAGNESIA) suspension 30 mL  30 mL Oral Daily PRN Kerry Hough, PA      . methocarbamol (ROBAXIN) tablet 500 mg  500 mg Oral Q8H PRN Kerry Hough, PA   500 mg at 10/05/12 2956  . naproxen (NAPROSYN) tablet 500 mg  500 mg Oral BID PRN Kerry Hough, PA   500 mg at 10/05/12 0349  . ondansetron (ZOFRAN-ODT) disintegrating tablet 4 mg  4 mg Oral Q6H PRN Kerry Hough, PA   4 mg at 10/05/12 0448  . traZODone (DESYREL) tablet 50 mg  50 mg Oral QHS,MR X 1 Kerry Hough, PA      . [  DISCONTINUED] nicotine (NICODERM CQ - dosed in mg/24 hours) patch 21 mg  21 mg Transdermal Q0600 Kerry Hough, PA        Lab Results:  Results for orders placed during the hospital encounter of 10/03/12 (from the past 48 hour(s))  URINE RAPID DRUG SCREEN (HOSP PERFORMED)     Status: Abnormal   Collection Time   10/03/12  5:43 PM      Component Value Range Comment   Opiates POSITIVE (*) NONE DETECTED    Cocaine POSITIVE (*) NONE DETECTED    Benzodiazepines POSITIVE (*) NONE DETECTED    Amphetamines NONE DETECTED  NONE DETECTED    Tetrahydrocannabinol POSITIVE (*) NONE DETECTED    Barbiturates NONE DETECTED  NONE DETECTED   POCT PREGNANCY, URINE     Status: Normal   Collection Time   10/03/12  5:49 PM      Component Value Range Comment   Preg Test, Ur NEGATIVE  NEGATIVE   ACETAMINOPHEN LEVEL     Status: Normal   Collection Time   10/03/12  5:55 PM      Component Value Range Comment   Acetaminophen  (Tylenol), Serum <15.0  10 - 30 ug/mL   CBC     Status: Normal   Collection Time   10/03/12  5:55 PM      Component Value Range Comment   WBC 5.8  4.0 - 10.5 K/uL    RBC 4.41  3.87 - 5.11 MIL/uL    Hemoglobin 13.4  12.0 - 15.0 g/dL    HCT 82.9  56.2 - 13.0 %    MCV 85.7  78.0 - 100.0 fL    MCH 30.4  26.0 - 34.0 pg    MCHC 35.4  30.0 - 36.0 g/dL    RDW 86.5  78.4 - 69.6 %    Platelets 180  150 - 400 K/uL   COMPREHENSIVE METABOLIC PANEL     Status: Normal   Collection Time   10/03/12  5:55 PM      Component Value Range Comment   Sodium 139  135 - 145 mEq/L    Potassium 3.5  3.5 - 5.1 mEq/L    Chloride 104  96 - 112 mEq/L    CO2 26  19 - 32 mEq/L    Glucose, Bld 89  70 - 99 mg/dL    BUN 12  6 - 23 mg/dL    Creatinine, Ser 2.95  0.50 - 1.10 mg/dL    Calcium 9.2  8.4 - 28.4 mg/dL    Total Protein 6.5  6.0 - 8.3 g/dL    Albumin 3.7  3.5 - 5.2 g/dL    AST 17  0 - 37 U/L    ALT 9  0 - 35 U/L    Alkaline Phosphatase 75  39 - 117 U/L    Total Bilirubin 0.3  0.3 - 1.2 mg/dL    GFR calc non Af Amer >90  >90 mL/min    GFR calc Af Amer >90  >90 mL/min   ETHANOL     Status: Normal   Collection Time   10/03/12  5:55 PM      Component Value Range Comment   Alcohol, Ethyl (B) <11  0 - 11 mg/dL   SALICYLATE LEVEL     Status: Abnormal   Collection Time   10/03/12  5:55 PM      Component Value Range Comment   Salicylate Lvl <2.0 (*) 2.8 - 20.0 mg/dL  Physical Findings: AIMS: Facial and Oral Movements Muscles of Facial Expression: None, normal Lips and Perioral Area: None, normal Jaw: None, normal Tongue: None, normal,Extremity Movements Upper (arms, wrists, hands, fingers): None, normal Lower (legs, knees, ankles, toes): None, normal, Trunk Movements Neck, shoulders, hips: None, normal, Overall Severity Severity of abnormal movements (highest score from questions above): None, normal Incapacitation due to abnormal movements: None, normal Patient's awareness of abnormal  movements (rate only patient's report): No Awareness, Dental Status Current problems with teeth and/or dentures?: No Does patient usually wear dentures?: No  CIWA:  CIWA-Ar Total: 3  COWS:  COWS Total Score: 11   Treatment Plan Summary: Daily contact with patient to assess and evaluate symptoms and progress in treatment Medication management  Plan: Pursue detox further           Supportive approach/coping skills/relapse prevention  Savalas Monje A 10/05/2012, 3:14 PM

## 2012-10-05 NOTE — Progress Notes (Signed)
D: Patient denies SI/HI and auditory and visual hallucinations. The patient has an anxious mood and affect. The patient rates her depression a 6 out of 10 and her hopelessness a 7 out of 10 (1 low/10 high). The patient reports sleeping poorly and states that her appetite is poor and her energy level is low.  A: Patient given emotional support from RN. Patient encouraged to come to staff with concerns and/or questions. Patient's medication routine continued. Patient's orders and plan of care reviewed.  R: Patient remains appropriate and cooperative. Will continue to monitor patient q15 minutes for safety.

## 2012-10-05 NOTE — Progress Notes (Signed)
Psychoeducational Group Note  Date:  10/05/2012 Time:  2000  Group Topic/Focus:  Wrap-Up Group:   The focus of this group is to help patients review their daily goal of treatment and discuss progress on daily workbooks.  Participation Level:  Active  Participation Quality:  Appropriate  Affect:  Anxious and Appropriate  Cognitive:  Appropriate  Insight:  Good  Engagement in Group:  Good  Additional Comments:  Patient shared that her goal is to overcome her withdrawal symptoms as well as anxiety.   Lyndee Hensen 10/05/2012, 11:12 PM
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
## 2012-10-05 NOTE — Progress Notes (Signed)
Pt is observed up and active on the unit.  She attended evening group tonight.  She reports her withdrawal symptoms have decreased some, but she is still experiencing moderate discomfort.  She is receiving prn meds as ordered.  She denies SI/HI/AV.  She makes her needs known to staff.  She reports that she will go to long term tx at [facility] that has already been arranged.  Support/encouragement given.  Safety maintained with q15 minute checks.

## 2012-10-06 DIAGNOSIS — F19939 Other psychoactive substance use, unspecified with withdrawal, unspecified: Secondary | ICD-10-CM

## 2012-10-06 MED ORDER — SALINE SPRAY 0.65 % NA SOLN
1.0000 | NASAL | Status: DC | PRN
  Administered 2012-10-07: 1 via NASAL
  Filled 2012-10-06: qty 44

## 2012-10-06 MED ORDER — FLUOXETINE HCL 20 MG PO CAPS
20.0000 mg | ORAL_CAPSULE | Freq: Every day | ORAL | Status: DC
  Administered 2012-10-06 – 2012-10-07 (×2): 20 mg via ORAL
  Filled 2012-10-06 (×3): qty 1
  Filled 2012-10-06: qty 14
  Filled 2012-10-06: qty 1

## 2012-10-06 NOTE — Progress Notes (Signed)
[facility] MD Progress Note  10/06/2012 12:02 PM Margaret [name]  MRN:  [id number]  Diagnosis:  Opioid dependence, withdrawal, Major Depression  ADL's:  Intact  Sleep: Fair  Appetite:  Fair  Suicidal Ideation:  Plan:  Denies Intent:  Denies Means:  Denies Homicidal Ideation:  Plan:  Denies Intent:  Denies Means:  Denies  Detox is going well. She admits to underlying depression. She did well on Prozac in the past, will like to be back on it. The depression she feels is distinct from the mood instability from her drug use  Mental Status Examination/Evaluation: Objective:  Appearance: Fairly Groomed  Patent attorney::  Fair  Speech:  Clear and Coherent and Normal Rate  Volume:  Normal  Mood:  Worried, missing her children, sad  Affect:  Appropriate  Thought Process:  Coherent and Goal Directed  Orientation:  Full  Thought Content:  WDL  Suicidal Thoughts:  No  Homicidal Thoughts:  No  Memory:  Immediate;   Fair Recent;   Fair Remote;   Fair  Judgement:  Fair  Insight:  Present  Psychomotor Activity:  Normal  Concentration:  Fair  Recall:  Fair  Akathisia:  No  Handed:  Right  AIMS (if indicated):     Assets:  Communication Skills  Sleep:  Number of Hours: 6.5    Vital Signs:Blood pressure 105/71, pulse 73, temperature 97.2 F (36.2 C), temperature source Oral, resp. rate 16, height 5\' 1"  (1.549 m), weight 69.854 kg (154 lb). Current Medications: Current Facility-Administered Medications  Medication Dose Route Frequency Provider Last Rate Last Dose  . acetaminophen (TYLENOL) tablet 650 mg  650 mg Oral Q6H PRN Kerry Hough, PA      . alum & mag hydroxide-simeth (MAALOX/MYLANTA) 200-200-20 MG/5ML suspension 30 mL  30 mL Oral Q4H PRN Kerry Hough, PA      . [EXPIRED] cloNIDine (CATAPRES) tablet 0.1 mg  0.1 mg Oral QID Kerry Hough, PA   0.1 mg at 10/05/12 1650   Followed by  . cloNIDine (CATAPRES) tablet 0.1 mg  0.1 mg Oral BH-qamhs Kerry Hough, PA   0.1 mg  at 10/06/12 4098   Followed by  . cloNIDine (CATAPRES) tablet 0.1 mg  0.1 mg Oral QAC breakfast Kerry Hough, PA      . dicyclomine (BENTYL) tablet 20 mg  20 mg Oral Q6H PRN Kerry Hough, PA   20 mg at 10/06/12 1191  . FLUoxetine (PROZAC) capsule 20 mg  20 mg Oral Daily Rachael Fee, MD   20 mg at 10/06/12 1135  . gabapentin (NEURONTIN) capsule 300 mg  300 mg Oral TID Nanine Means, NP   300 mg at 10/06/12 1135  . hydrOXYzine (ATARAX/VISTARIL) tablet 25 mg  25 mg Oral Q6H PRN Kerry Hough, PA   25 mg at 10/05/12 0349  . loperamide (IMODIUM) capsule 2-4 mg  2-4 mg Oral PRN Kerry Hough, PA   2 mg at 10/05/12 1811  . LORazepam (ATIVAN) tablet 1 mg  1 mg Oral Q4H PRN Nanine Means, NP   1 mg at 10/06/12 0958  . magnesium hydroxide (MILK OF MAGNESIA) suspension 30 mL  30 mL Oral Daily PRN Kerry Hough, PA      . methocarbamol (ROBAXIN) tablet 500 mg  500 mg Oral Q8H PRN Kerry Hough, PA   500 mg at 10/05/12 2145  . naproxen (NAPROSYN) tablet 500 mg  500 mg Oral BID PRN Kerry Hough, PA  500 mg at 10/05/12 0349  . ondansetron (ZOFRAN-ODT) disintegrating tablet 4 mg  4 mg Oral Q6H PRN Kerry Hough, PA   4 mg at 10/05/12 0448  . sodium chloride (OCEAN) 0.65 % nasal spray 1 spray  1 spray Each Nare PRN Rachael Fee, MD      . traZODone (DESYREL) tablet 50 mg  50 mg Oral QHS,MR X 1 Kerry Hough, PA   50 mg at 10/05/12 2145    Lab Results: No results found for this or any previous visit (from the past 48 hour(s)).  Physical Findings: AIMS: Facial and Oral Movements Muscles of Facial Expression: None, normal Lips and Perioral Area: None, normal Jaw: None, normal Tongue: None, normal,Extremity Movements Upper (arms, wrists, hands, fingers): None, normal Lower (legs, knees, ankles, toes): None, normal, Trunk Movements Neck, shoulders, hips: None, normal, Overall Severity Severity of abnormal movements (highest score from questions above): None, normal Incapacitation due to  abnormal movements: None, normal Patient's awareness of abnormal movements (rate only patient's report): No Awareness, Dental Status Current problems with teeth and/or dentures?: No Does patient usually wear dentures?: No  CIWA:  CIWA-Ar Total: 2  COWS:  COWS Total Score: 6   Treatment Plan Summary: Daily contact with patient to assess and evaluate symptoms and progress in treatment Medication management  Plan: Supportive approach/coping skills/relapse prevention Resume Prozac 20 mg daily [place] nasal spray   Keith Felten A 10/06/2012, 12:02 PM

## 2012-10-06 NOTE — Progress Notes (Signed)
Patient ID: Margaret [name], female   DOB: 21-Feb-1976, 36 y.o.   MRN: [id number] D- patient requested medication to sleep.  She reports her appetite is improving.  Her energy level is low and she rates her depression a 5 and hopelessness a 3.  She is still craving and is having some chilling and tremors as well as feeling agitated,  Ativan given for anxiety,  Patient states she feels like her skin is "itchy craw ley."  A- Supported patient and R- she says she hopes to go to long term treatment at [facility].

## 2012-10-07 DIAGNOSIS — F1910 Other psychoactive substance abuse, uncomplicated: Secondary | ICD-10-CM

## 2012-10-07 MED ORDER — TRAZODONE HCL 50 MG PO TABS: 50.0000 mg | ORAL_TABLET | Freq: Every evening | ORAL | Status: DC | PRN

## 2012-10-07 MED ORDER — GABAPENTIN 300 MG PO CAPS: 300.0000 mg | ORAL_CAPSULE | Freq: Three times a day (TID) | ORAL | Status: DC

## 2012-10-07 MED ORDER — FLUOXETINE HCL 20 MG PO CAPS: 20.0000 mg | ORAL_CAPSULE | Freq: Every day | ORAL | Status: DC

## 2012-10-07 MED ORDER — IBUPROFEN 200 MG PO TABS: 800.0000 mg | ORAL_TABLET | Freq: Four times a day (QID) | ORAL | Status: DC | PRN

## 2012-10-07 NOTE — Discharge Summary (Signed)
Physician Discharge Summary Note  Patient:  Margaret [name] is an 36 y.o., female MRN:  [id number] DOB:  Apr 16, 1976 Patient phone:  [phone] (home)  Patient address:   [address] [place] [address],   Date of Admission:  10/04/2012 Date of Discharge: 10/07/2012  Reason for Admission:  Opioid dependence/withdrawal, depression  Discharge Diagnoses: Active Problems:  Opioid dependence  Major depression  Axis Diagnosis:  AXIS I:  Major Depression, single episode and Substance Abuse AXIS II:  Deferred AXIS III:   Past Medical History  Diagnosis Date  . Drug use   . Hypertension   . Heart murmur    AXIS IV:  economic problems, housing problems, occupational problems, other psychosocial or environmental problems, problems related to social environment and problems with primary support group AXIS V:  51-60 moderate symptoms  Level of Care:  [facility]  Hospital Course:   Patient attended individual and group therapy while inpatient along with attending NA groups, one-one time with MD daily, medications for detox managed during inpatient, follow-up appointments made prior to discharge, will continue rehab at [facility]   Consults:  None  Significant Diagnostic Studies:  labs: Completed and reviewed in the ED, stable  Discharge Vitals:   Blood pressure 96/66, pulse 92, temperature 97.5 F (36.4 C), temperature source Oral, resp. rate 18, height 5\' 1"  (1.549 m), weight 69.854 kg (154 lb). Lab Results:   No results found for this or any previous visit (from the past 72 hour(s)).  Physical Findings: AIMS: Facial and Oral Movements Muscles of Facial Expression: None, normal Lips and Perioral Area: None, normal Jaw: None, normal Tongue: None, normal,Extremity Movements Upper (arms, wrists, hands, fingers): None, normal Lower (legs, knees, ankles, toes): None, normal, Trunk Movements Neck, shoulders, hips: None, normal, Overall  Severity Severity of abnormal movements (highest score from questions above): None, normal Incapacitation due to abnormal movements: None, normal Patient's awareness of abnormal movements (rate only patient's report): No Awareness, Dental Status Current problems with teeth and/or dentures?: No Does patient usually wear dentures?: No  CIWA:  CIWA-Ar Total: 4  COWS:  COWS Total Score: 4   Mental Status Exam: See Mental Status Examination and Suicide Risk Assessment completed by Attending Physician prior to discharge.  Discharge destination:  [facility]  Is patient on multiple antipsychotic therapies at discharge:  No   Has Patient had three or more failed trials of antipsychotic monotherapy by history:  No Recommended Plan for Multiple Antipsychotic Therapies:  N/A  Discharge Orders    Future Orders Please Complete By Expires   Diet - low sodium heart healthy      Activity as tolerated - No restrictions          Medication List     As of 10/07/2012  9:35 AM    TAKE these medications      Indication    FLUoxetine 20 MG capsule   Commonly known as: PROZAC   Take 1 capsule (20 mg total) by mouth daily.    Indication: Depression      gabapentin 300 MG capsule   Commonly known as: NEURONTIN   Take 1 capsule (300 mg total) by mouth 3 (three) times daily.    Indication: Agitation, Cocaine Dependence      ibuprofen 200 MG tablet   Commonly known as: ADVIL,MOTRIN   Take 4 tablets (800 mg total) by mouth every 6 (six) hours as needed for pain. Pain    Indication:  Mild to Moderate Pain      traZODone 50 MG tablet   Commonly known as: DESYREL   Take 1 tablet (50 mg total) by mouth at bedtime and may repeat dose one time if needed.    Indication: Trouble Sleeping           Follow-up Information    Follow up with [facility]. On 10/12/2012. (Follow up with Probation Officer on Monday for admission time)     Contact information:   [facility] [address] [place], [place] [address] Phone: [phone] Fax: [phone]        Follow-up recommendations:  Activity as tolerated, low-sodium heart healthy diet   Comments:  Patient denied suicidal/homicidal ideations and auditory/visual hallucinations, follow-up appointments encouraged to attend, outside support groups encouraged and information given, Barbarita will continue her rehab at [facility] inpatient.  SignedNanine Means, PMH-NP 10/07/2012, 9:35 AM

## 2012-10-07 NOTE — Progress Notes (Signed)
[facility] Case Management Discharge Plan:  Will you be returning to the same living situation after discharge: Yes,  returning home At discharge, do you have transportation home?:Yes,  boyfriend will transport pt home Do you have the ability to pay for your medications:Yes,  access to meds  Release of information consent forms completed and in the chart;  Patient's signature needed at discharge.  Patient to Follow up at:  Follow-up Information    Follow up with [facility]. On 10/12/2012. (Follow up with Probation Officer on Monday for admission time)    Contact information:   [facility] [address] [place], [place] [address] Phone: [phone] Fax: [phone]         Patient denies SI/HI:   Yes,  denies SI/HI today    Safety Planning and Suicide Prevention discussed:  Yes,  discussed with pt  Barrier to discharge identified:No.  Summary and Recommendations: Pt attended discharge planning group and actively participated in group.  CSW provided pt with today's workbook.  Pt presents with calm mood and affect today.  Pt rates depression and anxiety at a 2-3 today.  Pt denies SI/HI.  Pt reports feeling stable to d/c today.  Pt states that she is to check in with her probation officer on Monday to go to treatment next week.  Pt gave consent for CSW to speak with PO but no number and CSW was unable to locate a valid number.  CSW left a message for [facility] regarding pt's admission but no return phone call has been made.  No recommendations from CSW.  No further needs voiced by pt.  Pt stable to discharge.     Carmina Miller 10/07/2012, 9:00 AM

## 2012-10-07 NOTE — Progress Notes (Signed)
Patient ID: Margaret [name], female   DOB: [birth year], 37 y.o.   MRN: [id number] Resting quietly tonight, eyes closed., resp reg, unlabored, no c/o's voiced.  Will continue to monitor q 15 minutes for safety.  Remains safe on unit at this time.

## 2012-10-07 NOTE — Tx Team (Signed)
Interdisciplinary Treatment Plan Update (Adult)  Date:  10/07/2012  Time Reviewed:  9:36 AM   Progress in Treatment: Attending groups: Yes Participating in groups:  Yes Taking medication as prescribed: Yes Tolerating medication:  Yes Family/Significant othe contact made:  No Patient understands diagnosis:  Yes Discussing patient identified problems/goals with staff:  Yes Medical problems stabilized or resolved:  Yes Denies suicidal/homicidal ideation: Yes Issues/concerns per patient self-inventory:  None identified Other: N/A  New problem(s) identified: None Identified  Reason for Continuation of Hospitalization: Stable to d/c  Interventions implemented related to continuation of hospitalization: Stable to d/c  Additional comments: N/A  Estimated length of stay: D/C today  Discharge Plan: Pt will follow up with [facility] for further treatment.    New goal(s): N/A  Review of initial/current patient goals per problem list:    1.  Goal(s): Address substance use by completing detox protocol  Met:  Yes  Target date: today   As evidenced by: Pt completed detox protocol.  2.  Goal (s): Reduce depressive symptoms by reducing from a 10 to a 3  Met:  Yes  Target date: today  As evidenced by: Pt rates at a 2-3 today.     3.  Goal (s): Reduce anxiety symptoms by reducing from a 10 to a 3  Met:  Yes  Target date: today  As evidenced by: Pt rates at a 2-3 today.     Attendees: Patient:   10/07/2012 9:36 AM   Family:     Physician:  Geoffery Lyons, MD 10/07/2012 9:36 AM   Nursing: Alease Frame, RN 10/07/2012 9:36 AM   Clinical Social Worker:  Reyes Ivan, LCSWA 10/07/2012 9:36 AM   Other: Shelda Jakes, RN 10/07/2012 9:36 AM   Other:     Other:     Other:     Other:      Scribe for Treatment Team:   Reyes Ivan 10/07/2012 9:36 AM

## 2012-10-07 NOTE — Progress Notes (Signed)
DISCHARGE NOTE:  Discharged from facility this morning to home, picked up by BF, given samples of meds, scripts and instructions/appointments. Denies Si or HI, ready to be discharged. Knows she has appt at [facility] and will make sure she is there, no issues or concerns at this time.

## 2012-10-07 NOTE — BHH Suicide Risk Assessment (Signed)
Suicide Risk Assessment  Discharge Assessment     Demographic Factors:  Caucasian  Mental Status Per Nursing Assessment::   On Admission:     Current Mental Status by Physician: In full contact with reality. There are no suicidal ideas, plans or intent. He is through the Detox protocol. He feels better. He wants to go home, give it couple of days and see how he is going to feel. If he was to experience worsening of his baseline pain, he plans to go to a Suboxone clinic in [place]. He admits that he feels much better today, so not sure if he is going to pursue the Suboxone.    Loss Factors: Decline in physical health  Historical Factors: NA  Risk Reduction Factors:   Responsible for children under 92 years of age, Sense of responsibility to family, Living with another person, especially a relative and Positive social support  Continued Clinical Symptoms:  Depression:   Comorbid alcohol abuse/dependence Alcohol/Substance Abuse/Dependencies  Cognitive Features That Contribute To Risk: No evidence   Suicide Risk:  Minimal: No identifiable suicidal ideation.  Patients presenting with no risk factors but with morbid ruminations; may be classified as minimal risk based on the severity of the depressive symptoms  Discharge Diagnoses:   AXIS I:  Opioid Dependence, S/P Opioid Withdrawal, Depressive Disorder AXIS II:  Deferred AXIS III:   Past Medical History  Diagnosis Date  . Drug use   . Hypertension   . Heart murmur    AXIS IV:  None identified AXIS V:  61-70 mild symptoms  Plan Of Care/Follow-up recommendations:  Activity:  As tolerated Diet:  Regular Will follow up with Suboxone Clinic  Is patient on multiple antipsychotic therapies at discharge:  No   Has Patient had three or more failed trials of antipsychotic monotherapy by history:  No  Recommended Plan for Multiple Antipsychotic Therapies: N/A   Lacoya Wilbanks A 10/07/2012, 8:16 AM
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
## 2012-10-07 NOTE — Progress Notes (Addendum)
[facility] Group Notes:  (Counselor/Nursing/MHT/Case Management/Adjunct)  10/07/2012 10:49 AM  Type of Therapy:  Psychoeducational Skills  Participation Level:  Active  Participation Quality:  Appropriate, Attentive, Sharing and Supportive  Affect:  Appropriate and Flat  Cognitive:  Appropriate  Insight:  Good  Engagement in Group:  Good  Engagement in Therapy:  Good  Modes of Intervention:  Activity, Education, Problem-solving and Support  Summary of Progress/Problems:Pt attended and participated in group where pts played "Picture not so perfect". Pt participated in discussion after activity in which pts discussed how describing the picture to the other pt was difficult as they could not use a number of words. Pt stated it was important to give the other pt clear directions and be patient when they are unable to understand those directions.  Pt related this activity to her real life situation as she has a hard time clearly understanding what her boyfriend is trying to explain to her sometimes. Pt stated that she never asks for direction from him because she knows she can never use the same directions that he gives her. Pt was able to relate this situation to other situations in her life. Pt showed good insight and understanding of this material.     Dalia Heading 10/07/2012, 10:49 AM

## 2012-10-11 NOTE — Progress Notes (Signed)
Patient Discharge Instructions:  After Visit Summary (AVS):   Faxed to:  10/11/12 Psychiatric Admission Assessment Note:   Faxed to:  10/11/12 Suicide Risk Assessment - Discharge Assessment:   Faxed to:  10/11/12 Faxed/Sent to the Next Level Care provider:  10/11/12 Faxed to [facility] @ [phone]  Jerelene Redden, 10/11/2012, 3:27 PM

## 2012-10-14 NOTE — Discharge Summary (Signed)
Agree with assessment and plan Blondell Laperle A. Tomia Enlow, M.D. 

## 2016-10-22 ENCOUNTER — Emergency Department (HOSPITAL_COMMUNITY)
Admission: EM | Admit: 2016-10-22 | Discharge: 2016-10-22 | Discharge status: Against Medical Advice | Payer: Self-pay | Attending: Emergency Medicine | Admitting: Emergency Medicine

## 2016-10-22 ENCOUNTER — Encounter (HOSPITAL_COMMUNITY): Payer: Self-pay | Admitting: *Deleted

## 2016-10-22 DIAGNOSIS — I10 Essential (primary) hypertension: Secondary | ICD-10-CM | POA: Insufficient documentation

## 2016-10-22 DIAGNOSIS — Z87891 Personal history of nicotine dependence: Secondary | ICD-10-CM | POA: Insufficient documentation

## 2016-10-22 DIAGNOSIS — F1210 Cannabis abuse, uncomplicated: Secondary | ICD-10-CM | POA: Insufficient documentation

## 2016-10-22 DIAGNOSIS — F1410 Cocaine abuse, uncomplicated: Secondary | ICD-10-CM | POA: Insufficient documentation

## 2016-10-22 DIAGNOSIS — Z79899 Other long term (current) drug therapy: Secondary | ICD-10-CM | POA: Insufficient documentation

## 2016-10-22 DIAGNOSIS — F1110 Opioid abuse, uncomplicated: Secondary | ICD-10-CM | POA: Insufficient documentation

## 2016-10-22 DIAGNOSIS — F1910 Other psychoactive substance abuse, uncomplicated: Secondary | ICD-10-CM

## 2016-10-22 LAB — COMPREHENSIVE METABOLIC PANEL
ALBUMIN: 3.6 g/dL (ref 3.5–5.0)
ALK PHOS: 68 U/L (ref 38–126)
ALT: 19 U/L (ref 14–54)
ANION GAP: 9 (ref 5–15)
AST: 21 U/L (ref 15–41)
BILIRUBIN TOTAL: 0.7 mg/dL (ref 0.3–1.2)
BUN: 8 mg/dL (ref 6–20)
CALCIUM: 9.8 mg/dL (ref 8.9–10.3)
CO2: 23 mmol/L (ref 22–32)
Chloride: 109 mmol/L (ref 101–111)
Creatinine, Ser: 0.67 mg/dL (ref 0.44–1.00)
GLUCOSE: 120 mg/dL — AB (ref 65–99)
POTASSIUM: 3.6 mmol/L (ref 3.5–5.1)
Sodium: 141 mmol/L (ref 135–145)
TOTAL PROTEIN: 6.8 g/dL (ref 6.5–8.1)

## 2016-10-22 LAB — RAPID URINE DRUG SCREEN, HOSP PERFORMED
AMPHETAMINES: NOT DETECTED
Barbiturates: NOT DETECTED
Benzodiazepines: NOT DETECTED
COCAINE: POSITIVE — AB
OPIATES: POSITIVE — AB
Tetrahydrocannabinol: POSITIVE — AB

## 2016-10-22 LAB — CBC
HCT: 34.6 % — ABNORMAL LOW (ref 36.0–46.0)
HEMOGLOBIN: 11.3 g/dL — AB (ref 12.0–15.0)
MCH: 27.4 pg (ref 26.0–34.0)
MCHC: 32.7 g/dL (ref 30.0–36.0)
MCV: 84 fL (ref 78.0–100.0)
PLATELETS: 215 10*3/uL (ref 150–400)
RBC: 4.12 MIL/uL (ref 3.87–5.11)
RDW: 12.7 % (ref 11.5–15.5)
WBC: 5.4 10*3/uL (ref 4.0–10.5)

## 2016-10-22 LAB — ETHANOL: Alcohol, Ethyl (B): <5 mg/dL (ref <5)

## 2016-10-22 NOTE — ED Notes (Signed)
Pt eloped, unable to be found. MD aware.

## 2016-10-22 NOTE — ED Notes (Signed)
Pt last used heroin around 1600 yesterday afternoon. Pt reports having all of the symptoms that go along with withdrawal. Headache, light sensitivity, upset stomach, etc.

## 2016-10-22 NOTE — Care Management (Signed)
Patient requesting heroin detox information. CM provided patient with detox resources to follow up with. CM expained that the hospital does not have a detox program. Patient verbalized understanding.

## 2016-10-22 NOTE — ED Triage Notes (Signed)
Pt requesting detox from heroin. Last use was yesterday, reports using on a daily basis. Denies SI/HI

## 2016-10-22 NOTE — ED Provider Notes (Signed)
[facility]-EM[facility] DEPT Provider Note   CSN: [id number] Arrival date & time: 10/22/16  1924     History   Chief Complaint Chief Complaint  Patient presents with  . Addiction Problem    HPI Darden Datesriscilla K Mijangos is a 40 y.o. female.  HPI Patient is a 40 year old female with past medical history significant for IV drug use who presents requesting detoxification. Patient reports her last heroin use was yesterday around 4 PM. Today she presents with multiple symptoms including generalized pain, chills abdominal cramping, and diarrhea. Denies fevers, nausea or vomiting.  Past Medical History:  Diagnosis Date  . Drug use   . Heart murmur   . Hypertension     Patient Active Problem List   Diagnosis Date Noted  . Opioid dependence (HCC) 10/04/2012  . Major depression 10/04/2012    Past Surgical History:  Procedure Laterality Date  . TUBAL LIGATION      OB History    No data available       Home Medications    Prior to Admission medications   Medication Sig Start Date End Date Taking? Authorizing Provider  loperamide (IMODIUM A-D) 2 MG tablet Take 2 mg by mouth 4 (four) times daily as needed for diarrhea or loose stools.   Yes Historical Provider, MD    Family History No family history on file.  Social History Social History  Substance Use Topics  . Smoking status: Former Games developermoker  . Smokeless tobacco: Never Used  . Alcohol use No     Comment: Pain Pills--Vicodin      Allergies   Patient has no known allergies.   Review of Systems Review of Systems  Constitutional: Positive for chills. Negative for fever.  HENT: Negative for ear pain and sore throat.   Eyes: Negative for pain and visual disturbance.  Respiratory: Negative for cough and shortness of breath.   Cardiovascular: Negative for chest pain and palpitations.  Gastrointestinal: Positive for abdominal pain and diarrhea. Negative for vomiting.  Genitourinary: Negative for dysuria and hematuria.    Musculoskeletal: Negative for arthralgias and back pain.  Skin: Negative for color change and rash.  Neurological: Positive for headaches. Negative for seizures and syncope.  All other systems reviewed and are negative.    Physical Exam Updated Vital Signs BP 129/93   Pulse (!) 59   Temp 98.1 F (36.7 C) (Oral)   Resp 24   SpO2 97%   Physical Exam  Constitutional: No distress.  Thin, appears chronically ill.  HENT:  Head: Normocephalic and atraumatic.  Eyes: EOM are normal.  Pupils are 1mm, equal bilaterally  Neck: Neck supple.  Cardiovascular: Normal rate and regular rhythm.   No murmur heard. Pulmonary/Chest: Effort normal and breath sounds normal. No respiratory distress.  Abdominal: Soft. She exhibits no distension. There is no tenderness.  Musculoskeletal:  Multiple track marks over bilateral upper extremities.  Neurological: She is alert.  Skin: Skin is warm and dry.  Psychiatric:  Mood is blunt. Denies suicidal ideation or homicidal ideation. Denies AVH.   Nursing note and vitals reviewed.    ED Treatments / Results  Labs (all labs ordered are listed, but only abnormal results are displayed) Labs Reviewed  COMPREHENSIVE METABOLIC PANEL - Abnormal; Notable for the following:       Result Value   Glucose, Bld 120 (*)    All other components within normal limits  CBC - Abnormal; Notable for the following:    Hemoglobin 11.3 (*)    HCT 34.6 (*)  All other components within normal limits  RAPID URINE DRUG SCREEN, HOSP PERFORMED - Abnormal; Notable for the following:    Opiates POSITIVE (*)    Cocaine POSITIVE (*)    Tetrahydrocannabinol POSITIVE (*)    All other components within normal limits  ETHANOL    EKG  EKG Interpretation None       Radiology No results found.  Procedures Procedures (including critical care time)  Medications Ordered in ED Medications - No data to display   Initial Impression / Assessment and Plan / ED Course  I  have reviewed the triage vital signs and the nursing notes.  Pertinent labs & imaging results that were available during my care of the patient were reviewed by me and considered in my medical decision making (see chart for details).  Clinical Course     Patient is a 40 year old female with past medical history of polysubstance abuse who presents seeking detox for heroin. She reports her last use was around 4 PM yesterday. She complains of symptoms including abdominal pain, diarrhea, diffuse body pain. She is hypertensive with otherwise stable vital signs. Exam reveals multiple track marks on her bilateral upper extremities. Abdomen is benign. Labs obtained in triage reviewed. CBC and CMP unremarkable. She is not pregnant. UDS is positive for cocaine, THC and opiates.   I discussed with patient that we will be unable to offer [facility] detoxification [facility]. Advised her that we'll consult case management for further community resources. Patient was discussed with case management and she was provided with a list of resources for detox centers. She was advised to call one of the centers, and if needed we can facilitate faxing her lab results to them.   Unfortunately, following this patient eloped prior to being seen by the emergency department attending, Dr. Anitra LauthPlunkett and completing any further work up. Patient left her paperwork with information regarding detoxification centers at the bedside.    Final Clinical Impressions(s) / ED Diagnoses   Final diagnoses:  Polysubstance abuse    New Prescriptions New Prescriptions   No medications on file     Isa RankinAnn B Vale Peraza, MD 10/23/16 [id number]    Gwyneth SproutWhitney Plunkett, MD 10/23/16 [phone]

## 2016-10-22 NOTE — ED Notes (Signed)
EDP at bedside  

## 2016-10-23 NOTE — ED Provider Notes (Deleted)
I saw and evaluated the patient, reviewed the resident's note and I agree with the findings and plan.   EKG Interpretation None      Polysubstance abuse  Patient is a 40 year old female presenting today for substance abuse and detox. Patient was seen by the resident initially. Patient had normal vital signs and it was discussed with the patient that we do not provide inpatient detox. Case management was called and gave patient resources however patient eloped prior to me seeing her because we were not offering inpatient detox   Gwyneth SproutWhitney Darien Mignogna, MD 10/23/16 [phone]

## 2017-04-15 ENCOUNTER — Inpatient Hospital Stay (HOSPITAL_COMMUNITY)
Admission: EM | Admit: 2017-04-15 | Discharge: 2017-04-17 | DRG: 603 | Disposition: A | Discharge status: Home / Self Care | Payer: Self-pay | Attending: Nephrology | Admitting: Nephrology

## 2017-04-15 ENCOUNTER — Encounter (HOSPITAL_COMMUNITY): Payer: Self-pay

## 2017-04-15 DIAGNOSIS — L03115 Cellulitis of right lower limb: Secondary | ICD-10-CM | POA: Diagnosis present

## 2017-04-15 DIAGNOSIS — F1210 Cannabis abuse, uncomplicated: Secondary | ICD-10-CM | POA: Diagnosis present

## 2017-04-15 DIAGNOSIS — L02415 Cutaneous abscess of right lower limb: Principal | ICD-10-CM | POA: Diagnosis present

## 2017-04-15 DIAGNOSIS — F1410 Cocaine abuse, uncomplicated: Secondary | ICD-10-CM | POA: Diagnosis present

## 2017-04-15 DIAGNOSIS — Z23 Encounter for immunization: Secondary | ICD-10-CM

## 2017-04-15 DIAGNOSIS — L0390 Cellulitis, unspecified: Secondary | ICD-10-CM | POA: Diagnosis present

## 2017-04-15 DIAGNOSIS — F1120 Opioid dependence, uncomplicated: Secondary | ICD-10-CM | POA: Diagnosis present

## 2017-04-15 DIAGNOSIS — L089 Local infection of the skin and subcutaneous tissue, unspecified: Secondary | ICD-10-CM

## 2017-04-15 DIAGNOSIS — F1910 Other psychoactive substance abuse, uncomplicated: Secondary | ICD-10-CM

## 2017-04-15 DIAGNOSIS — I10 Essential (primary) hypertension: Secondary | ICD-10-CM | POA: Diagnosis present

## 2017-04-15 DIAGNOSIS — T148XXA Other injury of unspecified body region, initial encounter: Secondary | ICD-10-CM

## 2017-04-15 DIAGNOSIS — F329 Major depressive disorder, single episode, unspecified: Secondary | ICD-10-CM | POA: Diagnosis present

## 2017-04-15 DIAGNOSIS — Z87891 Personal history of nicotine dependence: Secondary | ICD-10-CM

## 2017-04-15 LAB — CBC WITH DIFFERENTIAL/PLATELET
Basophils Absolute: 0.1 10*3/uL (ref 0.0–0.1)
Basophils Relative: 2 %
EOS ABS: 0.1 10*3/uL (ref 0.0–0.7)
Eosinophils Relative: 1 %
HCT: 39.3 % (ref 36.0–46.0)
Hemoglobin: 13.3 g/dL (ref 12.0–15.0)
Lymphocytes Relative: 28 %
Lymphs Abs: 1.4 10*3/uL (ref 0.7–4.0)
MCH: 29.2 pg (ref 26.0–34.0)
MCHC: 33.8 g/dL (ref 30.0–36.0)
MCV: 86.4 fL (ref 78.0–100.0)
MONO ABS: 0.4 10*3/uL (ref 0.1–1.0)
Monocytes Relative: 7 %
Neutro Abs: 3 10*3/uL (ref 1.7–7.7)
Neutrophils Relative %: 62 %
Platelets: 135 10*3/uL — ABNORMAL LOW (ref 150–400)
RBC: 4.55 MIL/uL (ref 3.87–5.11)
RDW: 12.8 % (ref 11.5–15.5)
WBC: 5 10*3/uL (ref 4.0–10.5)

## 2017-04-15 LAB — COMPREHENSIVE METABOLIC PANEL
ALK PHOS: 156 U/L — AB (ref 38–126)
ALT: 66 U/L — AB (ref 14–54)
ANION GAP: 11 (ref 5–15)
AST: 49 U/L — ABNORMAL HIGH (ref 15–41)
Albumin: 3.7 g/dL (ref 3.5–5.0)
BUN: 10 mg/dL (ref 6–20)
CALCIUM: 9.3 mg/dL (ref 8.9–10.3)
CHLORIDE: 103 mmol/L (ref 101–111)
CO2: 23 mmol/L (ref 22–32)
CREATININE: 0.72 mg/dL (ref 0.44–1.00)
Glucose, Bld: 100 mg/dL — ABNORMAL HIGH (ref 65–99)
Potassium: 3.7 mmol/L (ref 3.5–5.1)
Sodium: 137 mmol/L (ref 135–145)
Total Bilirubin: 0.8 mg/dL (ref 0.3–1.2)
Total Protein: 7.7 g/dL (ref 6.5–8.1)

## 2017-04-15 LAB — I-STAT CG4 LACTIC ACID, ED: LACTIC ACID, VENOUS: 1.32 mmol/L (ref 0.5–1.9)

## 2017-04-15 MED ORDER — ONDANSETRON HCL 4 MG/2ML IJ SOLN: 4.0000 mg | Freq: Four times a day (QID) | INTRAMUSCULAR | Status: DC | PRN

## 2017-04-15 MED ORDER — LORAZEPAM 2 MG/ML IJ SOLN: 1.0000 mg | Freq: Four times a day (QID) | INTRAMUSCULAR | Status: DC | PRN

## 2017-04-15 MED ORDER — THIAMINE HCL 100 MG/ML IJ SOLN
100.0000 mg | Freq: Every day | INTRAMUSCULAR | Status: DC
  Filled 2017-04-15: qty 2

## 2017-04-15 MED ORDER — LORAZEPAM 1 MG PO TABS: 1.0000 mg | ORAL_TABLET | Freq: Four times a day (QID) | ORAL | Status: DC | PRN

## 2017-04-15 MED ORDER — SODIUM CHLORIDE 0.9 % IV SOLN
INTRAVENOUS | Status: AC
  Administered 2017-04-15 – 2017-04-16 (×2): via INTRAVENOUS

## 2017-04-15 MED ORDER — FOLIC ACID 1 MG PO TABS
1.0000 mg | ORAL_TABLET | Freq: Every day | ORAL | Status: DC
  Administered 2017-04-15 – 2017-04-17 (×3): 1 mg via ORAL
  Filled 2017-04-15 (×3): qty 1

## 2017-04-15 MED ORDER — HEPARIN SODIUM (PORCINE) 5000 UNIT/ML IJ SOLN
5000.0000 [IU] | Freq: Three times a day (TID) | INTRAMUSCULAR | Status: DC
  Administered 2017-04-16: 5000 [IU] via SUBCUTANEOUS
  Filled 2017-04-15 (×3): qty 1

## 2017-04-15 MED ORDER — ACETAMINOPHEN 325 MG PO TABS
650.0000 mg | ORAL_TABLET | Freq: Four times a day (QID) | ORAL | Status: DC | PRN
  Administered 2017-04-16: 650 mg via ORAL
  Filled 2017-04-15: qty 2

## 2017-04-15 MED ORDER — SENNOSIDES-DOCUSATE SODIUM 8.6-50 MG PO TABS: 1.0000 | ORAL_TABLET | Freq: Every evening | ORAL | Status: DC | PRN

## 2017-04-15 MED ORDER — IBUPROFEN 400 MG PO TABS
800.0000 mg | ORAL_TABLET | Freq: Three times a day (TID) | ORAL | Status: DC | PRN
  Administered 2017-04-16: 800 mg via ORAL
  Filled 2017-04-15: qty 2

## 2017-04-15 MED ORDER — ACETAMINOPHEN 650 MG RE SUPP: 650.0000 mg | Freq: Four times a day (QID) | RECTAL | Status: DC | PRN

## 2017-04-15 MED ORDER — ONDANSETRON HCL 4 MG PO TABS: 4.0000 mg | ORAL_TABLET | Freq: Four times a day (QID) | ORAL | Status: DC | PRN

## 2017-04-15 MED ORDER — LORAZEPAM 1 MG PO TABS: 0.0000 mg | ORAL_TABLET | Freq: Four times a day (QID) | ORAL | Status: DC

## 2017-04-15 MED ORDER — TETANUS-DIPHTH-ACELL PERTUSSIS 5-2.5-18.5 LF-MCG/0.5 IM SUSP
0.5000 mL | Freq: Once | INTRAMUSCULAR | Status: AC
  Administered 2017-04-15: 0.5 mL via INTRAMUSCULAR
  Filled 2017-04-15: qty 0.5

## 2017-04-15 MED ORDER — LIDOCAINE-EPINEPHRINE (PF) 2 %-1:200000 IJ SOLN
10.0000 mL | Freq: Once | INTRAMUSCULAR | Status: AC
  Administered 2017-04-15: 10 mL via INTRADERMAL
  Filled 2017-04-15: qty 20

## 2017-04-15 MED ORDER — VITAMIN B-1 100 MG PO TABS
100.0000 mg | ORAL_TABLET | Freq: Every day | ORAL | Status: DC
  Administered 2017-04-15 – 2017-04-17 (×3): 100 mg via ORAL
  Filled 2017-04-15 (×3): qty 1

## 2017-04-15 MED ORDER — ADULT MULTIVITAMIN W/MINERALS CH
1.0000 | ORAL_TABLET | Freq: Every day | ORAL | Status: DC
  Administered 2017-04-15 – 2017-04-17 (×3): 1 via ORAL
  Filled 2017-04-15 (×3): qty 1

## 2017-04-15 MED ORDER — VANCOMYCIN HCL IN DEXTROSE 750-5 MG/150ML-% IV SOLN
750.0000 mg | Freq: Two times a day (BID) | INTRAVENOUS | Status: DC
  Administered 2017-04-16 – 2017-04-17 (×3): 750 mg via INTRAVENOUS
  Filled 2017-04-15 (×6): qty 150

## 2017-04-15 MED ORDER — CLINDAMYCIN PHOSPHATE 600 MG/50ML IV SOLN
600.0000 mg | Freq: Once | INTRAVENOUS | Status: AC
  Administered 2017-04-15: 600 mg via INTRAVENOUS
  Filled 2017-04-15: qty 50

## 2017-04-15 MED ORDER — LORAZEPAM 1 MG PO TABS
0.0000 mg | ORAL_TABLET | Freq: Two times a day (BID) | ORAL | Status: DC
Start: 2017-04-17 — End: 2017-04-17

## 2017-04-15 MED ORDER — VANCOMYCIN HCL IN DEXTROSE 1-5 GM/200ML-% IV SOLN
1000.0000 mg | Freq: Once | INTRAVENOUS | Status: AC
  Administered 2017-04-15: 1000 mg via INTRAVENOUS
  Filled 2017-04-15: qty 200

## 2017-04-15 NOTE — ED Notes (Signed)
Attempted x 2 for IV start, but IV's blew both times

## 2017-04-15 NOTE — ED Triage Notes (Signed)
Per pt, Pt noticed an abscess to the right leg that started two weeks ago. Reports some fevers. Abscess has erythema and seroussanguinous discharge noted to it.

## 2017-04-15 NOTE — ED Provider Notes (Signed)
[facility] Provider Note   CSN: [id number] Arrival date & time: 04/15/17  1115  By signing my name below, I, Margaret [name], attest that this documentation has been prepared under the direction and in the presence of non-physician practitioner, Jaynie Crumble, PA-C. Electronically Signed: Rosana [name], ED Scribe. 04/15/17. 2:20 PM.  History   Chief Complaint Chief Complaint  Patient presents with  . Abscess   The history is provided by the patient. No language interpreter was used.   HPI Comments: Margaret [name] is a 41 y.o. female who presents to the Emergency Department complaining of a constant, gradually worsening area of pain and swelling to the right leg onset 1 week ago. Pt reports associated erythema (3 days ago), recent drainage from the area, and fever at 102 degrees F. Pt uses heroin and has injected into her legs. Tetanus is not UTD. No other complaints at this time.  Past Medical History:  Diagnosis Date  . Drug use   . Heart murmur   . Hypertension     Patient Active Problem List   Diagnosis Date Noted  . Opioid dependence (HCC) 10/04/2012  . Major depression 10/04/2012    Past Surgical History:  Procedure Laterality Date  . TUBAL LIGATION      OB History    No data available       Home Medications    Prior to Admission medications   Medication Sig Start Date End Date Taking? Authorizing Provider  loperamide (IMODIUM A-D) 2 MG tablet Take 2 mg by mouth 4 (four) times daily as needed for diarrhea or loose stools.    [provider]    Family History No family history on file.  Social History Social History  Substance Use Topics  . Smoking status: Former Games developer  . Smokeless tobacco: Never Used  . Alcohol use No     Comment: Pain Pills--Vicodin      Allergies   Patient has no known allergies.   Review of Systems Review of Systems  Constitutional: Positive for chills and fever (subjective).    Musculoskeletal: Positive for myalgias.  Skin: Positive for color change and wound.  All other systems reviewed and are negative.    Physical Exam Updated Vital Signs BP 130/79 (BP Location: Right Arm)   Pulse (!) 58   Temp 97.8 F (36.6 C) (Oral)   Resp 16   Ht 5' (1.524 m)   Wt 130 lb (59 kg)   SpO2 100%   BMI 25.39 kg/m   Physical Exam  Constitutional: She is oriented to person, place, and time. She appears well-developed and well-nourished.  HENT:  Head: Normocephalic and atraumatic.  Cardiovascular: Normal rate, regular rhythm and normal heart sounds.   Pulmonary/Chest: Effort normal and breath sounds normal. No respiratory distress. She has no wheezes.  Neurological: She is alert and oriented to person, place, and time.  Skin: Skin is warm and dry.  4x4 cm abscess with superficial area of skin nexrosis and maceration. There is fluctuance. Surrounding cellulitic changes extending up to the just distal of the knee and proximal to the ankle anteriorly wrapping slightlyto the back of the calf.   Psychiatric: She has a normal mood and affect.  Nursing note and vitals reviewed.    ED Treatments / Results  DIAGNOSTIC STUDIES: Oxygen Saturation is 100% on RA, normal by my interpretation.   COORDINATION OF CARE: 2:17 PM-Discussed next steps with pt including an I&D and antibiotics. Pt verbalized understanding and is agreeable with  the plan.   Labs (all labs ordered are listed, but only abnormal results are displayed) Labs Reviewed  COMPREHENSIVE METABOLIC PANEL - Abnormal; Notable for the following:       Result Value   Glucose, Bld 100 (*)    AST 49 (*)    ALT 66 (*)    Alkaline Phosphatase 156 (*)    All other components within normal limits  CBC WITH DIFFERENTIAL/PLATELET - Abnormal; Notable for the following:    Platelets 135 (*)    All other components within normal limits  I-STAT CG4 LACTIC ACID, ED    EKG  EKG Interpretation None        Radiology No results found.  Procedures .Marland Kitchen.Incision and Drainage Date/Time: 04/15/2017 3:01 PM Performed by: Jaynie CrumbleKIRICHENKO, Jadis Mika Authorized by: Jaynie CrumbleKIRICHENKO, Scarlettrose Costilow   Consent:    Consent obtained:  Verbal   Consent given by:  Patient   Risks discussed:  Infection, bleeding and pain   Alternatives discussed:  Alternative treatment Location:    Type:  Abscess   Size:  4 cm   Location:  Lower extremity   Lower extremity location:  Leg   Leg location:  R lower leg Pre-procedure details:    Skin preparation:  Betadine Anesthesia (see MAR for exact dosages):    Anesthesia method:  Local infiltration   Local anesthetic:  Lidocaine 2% WITH epi Procedure type:    Complexity:  Simple Procedure details:    Incision types:  Single straight   Incision depth:  Dermal   Scalpel blade:  11   Wound management:  Probed and deloculated and irrigated with saline   Drainage:  Bloody and purulent   Drainage amount:  Copious   Wound treatment:  Wound left open   Packing materials:  1/4 in gauze Post-procedure details:    Patient tolerance of procedure:  Tolerated well, no immediate complications      (including critical care time)  Medications Ordered in ED Medications - No data to display   Initial Impression / Assessment and Plan / ED Course  I have reviewed the triage vital signs and the nursing notes.  Pertinent labs & imaging results that were available during my care of the patient were reviewed by me and considered in my medical decision making (see chart for details).      Patient with a large skin abscess to the right lower leg from site of heroin injection about a week ago. Discussed with Dr. Madilyn Hookees who has seen the patient. Will perform I and D, labs look normal, will admit for IV antibiotics, clindamycin ordered. No evidence of systemic disease at this time.   Incision and drainage performed with large amounts of purulent and bloody drainage. Packed. There was some  superficial tissue necrosis noted that I debrided. Spoke with hospitalist will admit patient. There was a delay obtaining IV due to poor vein access. IV team paged.   Vitals:   04/15/17 1136 04/15/17 1635 04/15/17 1823 04/15/17 2116  BP: 130/79 114/81 128/78 99/67  Pulse: (!) 58  (!) 55 63  Resp: 16  16 18   Temp: 97.8 F (36.6 C)  97.6 F (36.4 C) 98.3 F (36.8 C)  TempSrc: Oral  Oral Oral  SpO2: 100%  99% 99%  Weight: 59 kg (130 lb)     Height: 5' (1.524 m)       Final Clinical Impressions(s) / ED Diagnoses   Final diagnoses:  Abscess of right lower leg    New Prescriptions Current  Discharge Medication List     I personally performed the services described in this documentation, which was scribed in my presence. The recorded information has been reviewed and is accurate.     Jaynie Crumble, PA-C 04/15/17 2117    Tilden Fossa, MD 04/16/17 0900

## 2017-04-15 NOTE — H&P (Addendum)
History and Physical  Margaret [name]:[id number] DOB: Aug 24, 1976 DOA: 04/15/2017  PCP: System, Provider Not In   Chief Complaint: abscess right leg  HPI: Margaret [name] is a 41 y.o. female IV drug abuser and multiple recreational drug abuser presented to ED with symptoms of a painful abscess right leg that started 2 weeks ago and associated with 3 days of spreading redness and heat and pain around the abscess.  She was seen in ED and I&D was performed and patient had significant swelling and redness of the leg worrisome for uncontrolled cellulitis.  She reported having high fever at home.  Dneies chills.  She reports tetanus not UTD.  She reports gradually worsening constant pressure and pain to right leg.  Duration of symptoms has been 3 days.  She is being admitted for IV antibiotics.    Review of Systems: All systems reviewed and apart from history of presenting illness, are negative.  Past Medical History:  Diagnosis Date  . Drug use   . Heart murmur   . Hypertension    Past Surgical History:  Procedure Laterality Date  . TUBAL LIGATION     Social History:  reports that she has quit smoking. She has never used smokeless tobacco. She reports that she uses drugs, including Cocaine, Heroin, Other-see comments, and Marijuana. She reports that she does not drink alcohol.  No Known Allergies  History reviewed. No pertinent family history.   Prior to Admission medications   Medication Sig Start Date End Date Taking? Authorizing Provider  methadone (DOLOPHINE) 10 MG tablet Take 60 mg by mouth daily.   Yes [provider]   Physical Exam: Vitals:   04/15/17 1136 04/15/17 1635  BP: 130/79 114/81  Pulse: (!) 58   Resp: 16   Temp: 97.8 F (36.6 C)   TempSrc: Oral   SpO2: 100%   Weight: 59 kg (130 lb)   Height: 5' (1.524 m)      General exam: Moderately built poorly groomed patient, lying comfortably supine on the gurney in no obvious distress.    Head, eyes and ENT: Nontraumatic and normocephalic. Pupils equally reacting to light and accommodation. Oral mucosa moist.  Neck: Supple. No JVD, carotid bruit or thyromegaly.  Lymphatics: No lymphadenopathy.  Respiratory system: Clear to auscultation. No increased work of breathing.  Cardiovascular system: S1 and S2 heard, RRR. No JVD, murmurs, gallops, clicks or pedal edema.  Gastrointestinal system: Abdomen is nondistended, soft and nontender. Normal bowel sounds heard. No organomegaly or masses appreciated.  Central nervous system: Alert and oriented. No focal neurological deficits.  Extremities: Symmetric 5 x 5 power. Peripheral pulses symmetrically felt.   Skin: multiple needle track marks in all extremities.  cellultis  RLE spreading redness noted with draining open abscess packed with gauze.          Musculoskeletal system: Negative exam.  Psychiatry: Pleasant and cooperative.   Labs on Admission:  Basic Metabolic Panel:  Recent Labs Lab 04/15/17 1151  NA 137  K 3.7  CL 103  CO2 23  GLUCOSE 100*  BUN 10  CREATININE 0.72  CALCIUM 9.3   Liver Function Tests:  Recent Labs Lab 04/15/17 1151  AST 49*  ALT 66*  ALKPHOS 156*  BILITOT 0.8  PROT 7.7  ALBUMIN 3.7   No results for input(s): LIPASE, AMYLASE in the last 168 hours. No results for input(s): AMMONIA in the last 168 hours. CBC:  Recent Labs Lab 04/15/17 1151  WBC 5.0  NEUTROABS 3.0  HGB 13.3  HCT 39.3  MCV 86.4  PLT 135*   Cardiac Enzymes: No results for input(s): CKTOTAL, CKMB, CKMBINDEX, TROPONINI in the last 168 hours.  BNP (last 3 results) No results for input(s): PROBNP in the last 8760 hours. CBG: No results for input(s): GLUCAP in the last 168 hours.  Radiological Exams on Admission: No results found.  Assessment/Plan Principal Problem:   Cellulitis and abscess of right leg Active Problems:   Opioid dependence (HCC)   Cellulitis   Polysubstance abuse   Wound  infection  1. Cellulitis of RLE - admit for IV vancomycin, follow blood cultures, obtain wound care consult.  Suspect this is MRSA.  I want to rule out bacteremia with 2 sets of blood cultures.   Also ordered for urine pregnancy test.  2. Opioid dependence - Pt goes to methadone clinic.  Will need to call clinic in Am to verify methadone dose and get restarted. Unable to do on admission as nurses have gone home from methadone clinic. Check HIV, hepatitis and blood cultures.  Consider echocardiogram to rule out vegetations. I didn't hear any murmur at this time on exam.  3. Polysubstance abuse - monitor for withdrawal.  CIWA. MVI, folate, thiamine. UDS still pending.    DVT Prophylaxis: lovenox Code Status: full   Family Communication: none present  Disposition Plan: home    Time spent: 55 mins  Standley Dakinslanford Mattilynn Forrer, MD [facility] Pager [phone]  If 7PM-7AM, please contact night-coverage [email or web address] Password [email or web address] 04/15/2017, 5:40 PM

## 2017-04-15 NOTE — Progress Notes (Signed)
Pharmacy Antibiotic Note  Darden Datesriscilla K Houlton is a 41 y.o. female admitted on 04/15/2017 with cellulitis  IVD abuse with RLE abscess.  Pharmacy has been consulted for vanomycin dosing.  Afebrile, WBC wnl, Cr stable 0.7.    Plan: Vancomycin 1gm IV x1 then 750mg  IV q12h   Height: 5' (152.4 cm) Weight: 130 lb (59 kg) IBW/kg (Calculated) : 45.5  Temp (24hrs), Avg:97.7 F (36.5 C), Min:97.6 F (36.4 C), Max:97.8 F (36.6 C)   Recent Labs Lab 04/15/17 1151 04/15/17 1206  WBC 5.0  --   CREATININE 0.72  --   LATICACIDVEN  --  1.32    Estimated Creatinine Clearance: 76.6 mL/min (by C-G formula based on SCr of 0.72 mg/dL).    No Known Allergies  Antimicrobials this admission: Clindamycin x1 in ED    Leota SauersLisa Estiben Mizuno Pharm.D. CPP, BCPS Clinical Pharmacist [phone] 04/15/2017 6:25 PM

## 2017-04-16 DIAGNOSIS — F1129 Opioid dependence with unspecified opioid-induced disorder: Secondary | ICD-10-CM

## 2017-04-16 DIAGNOSIS — L02415 Cutaneous abscess of right lower limb: Principal | ICD-10-CM

## 2017-04-16 DIAGNOSIS — L03115 Cellulitis of right lower limb: Secondary | ICD-10-CM

## 2017-04-16 LAB — CBC WITH DIFFERENTIAL/PLATELET
BASOS PCT: 1 %
Basophils Absolute: 0 10*3/uL (ref 0.0–0.1)
Eosinophils Absolute: 0.1 10*3/uL (ref 0.0–0.7)
Eosinophils Relative: 2 %
HEMATOCRIT: 34.3 % — AB (ref 36.0–46.0)
HEMOGLOBIN: 11.3 g/dL — AB (ref 12.0–15.0)
LYMPHS PCT: 50 %
Lymphs Abs: 1.7 10*3/uL (ref 0.7–4.0)
MCH: 28.5 pg (ref 26.0–34.0)
MCHC: 32.9 g/dL (ref 30.0–36.0)
MCV: 86.6 fL (ref 78.0–100.0)
MONOS PCT: 6 %
Monocytes Absolute: 0.2 10*3/uL (ref 0.1–1.0)
NEUTROS ABS: 1.4 10*3/uL — AB (ref 1.7–7.7)
Neutrophils Relative %: 41 %
Platelets: 114 10*3/uL — ABNORMAL LOW (ref 150–400)
RBC: 3.96 MIL/uL (ref 3.87–5.11)
RDW: 12.7 % (ref 11.5–15.5)
WBC: 3.4 10*3/uL — ABNORMAL LOW (ref 4.0–10.5)

## 2017-04-16 LAB — MRSA PCR SCREENING: MRSA BY PCR: POSITIVE — AB

## 2017-04-16 LAB — COMPREHENSIVE METABOLIC PANEL
ALK PHOS: 122 U/L (ref 38–126)
ALT: 42 U/L (ref 14–54)
ANION GAP: 9 (ref 5–15)
AST: 30 U/L (ref 15–41)
Albumin: 3 g/dL — ABNORMAL LOW (ref 3.5–5.0)
BILIRUBIN TOTAL: 0.4 mg/dL (ref 0.3–1.2)
BUN: 12 mg/dL (ref 6–20)
CALCIUM: 8.7 mg/dL — AB (ref 8.9–10.3)
CO2: 26 mmol/L (ref 22–32)
Chloride: 105 mmol/L (ref 101–111)
Creatinine, Ser: 0.83 mg/dL (ref 0.44–1.00)
Glucose, Bld: 97 mg/dL (ref 65–99)
Potassium: 3.6 mmol/L (ref 3.5–5.1)
Sodium: 140 mmol/L (ref 135–145)
Total Protein: 6.1 g/dL — ABNORMAL LOW (ref 6.5–8.1)

## 2017-04-16 LAB — HEPATITIS PANEL, ACUTE
HCV Ab: <0.1 s/co ratio (ref 0.0–0.9)
HEP A IGM: NEGATIVE
Hep B C IgM: NEGATIVE
Hepatitis B Surface Ag: NEGATIVE

## 2017-04-16 LAB — HIV ANTIBODY (ROUTINE TESTING W REFLEX): HIV Screen 4th Generation wRfx: NONREACTIVE

## 2017-04-16 MED ORDER — MUPIROCIN 2 % EX OINT
1.0000 "application " | TOPICAL_OINTMENT | Freq: Two times a day (BID) | CUTANEOUS | Status: DC
  Administered 2017-04-16 – 2017-04-17 (×3): 1 via NASAL
  Filled 2017-04-16: qty 22

## 2017-04-16 MED ORDER — MUPIROCIN 2 % EX OINT: 1.0000 "application " | TOPICAL_OINTMENT | Freq: Two times a day (BID) | CUTANEOUS | Status: DC

## 2017-04-16 MED ORDER — CHLORHEXIDINE GLUCONATE CLOTH 2 % EX PADS
6.0000 | MEDICATED_PAD | Freq: Every day | CUTANEOUS | Status: DC
  Administered 2017-04-17: 6 via TOPICAL

## 2017-04-16 MED ORDER — CHLORHEXIDINE GLUCONATE CLOTH 2 % EX PADS: 6.0000 | MEDICATED_PAD | Freq: Every day | CUTANEOUS | Status: DC

## 2017-04-16 MED ORDER — ENOXAPARIN SODIUM 40 MG/0.4ML ~~LOC~~ SOLN
40.0000 mg | SUBCUTANEOUS | Status: DC
  Administered 2017-04-16 – 2017-04-17 (×2): 40 mg via SUBCUTANEOUS
  Filled 2017-04-16 (×2): qty 0.4

## 2017-04-16 MED ORDER — METHADONE HCL 10 MG PO TABS
60.0000 mg | ORAL_TABLET | Freq: Every day | ORAL | Status: DC
  Administered 2017-04-16 – 2017-04-17 (×2): 60 mg via ORAL
  Filled 2017-04-16 (×2): qty 6

## 2017-04-16 MED ORDER — SODIUM CHLORIDE 0.9 % IV SOLN
INTRAVENOUS | Status: DC
  Administered 2017-04-16: 23:00:00 via INTRAVENOUS

## 2017-04-16 NOTE — Progress Notes (Signed)
Pharmacy consulted to resume home Methadone dose.   Methadone dosing confirmed by medication history technicians with [place],RN at [facility]. Confirmed patient received last dose on 04/15/17.   Order for Methadone 60mg  po daily has been entered which is consistent with home dose.   Pharmacy will sign off consult. Please re-consult if needed.  Link SnufferJessica Robecca Fulgham, PharmD, BCPS Clinical Pharmacist Clinical Phone 04/16/2017 until 3:30 PM - [phone] After hours, please call #[phone] 04/16/2017 , 9:57 AM

## 2017-04-16 NOTE — Progress Notes (Signed)
Patient refused pregnancy test. Stated she has her tubes tied.

## 2017-04-16 NOTE — Care Management Note (Signed)
Case Management Note  Patient Details  Name: Darden Datesriscilla K Mura MRN: [id number] Date of Birth: 03/09/1976  Subjective/Objective:   She has been staying with friends. pta indep, she has transportation at discharge.  NCM [facility], awaiting call back.  Patient states [facility] [place] after she is discharged, she has already been accepted there , they are waiting for her to heal from her cellulits.  She states they also help with meds. But she will not have ast with the meds for when she is discharged from hospital she will need $4 meds or Match Letter ast at discharge.   [facility] appt [facility]/12 at 10:30                   Action/Plan: NCM will follow for dc needs.   Expected Discharge Date:                  Expected Discharge Plan:  Home/Self Care  In-House Referral:     Discharge planning Services  CM Consult, Medication Assistance  Post Acute Care Choice:    Choice offered to:     DME Arranged:    DME Agency:     HH Arranged:    HH Agency:     Status of Service:  In process, will continue to follow  If discussed at Long Length of Stay Meetings, dates discussed:    Additional Comments:  Leone Havenaylor, Sheilia Reznick [place], RN 04/16/2017, 2:47 PM

## 2017-04-16 NOTE — Progress Notes (Signed)
PROGRESS NOTE    Darden Datesriscilla K [name]  ZOX:[id number] DOB: 09-24-1976 DOA: 04/15/2017 PCP: System, Provider Not In   Brief Narrative: Margaret [name] is a 41 y/o female with polysubstance abuse including opiates on methadone, cocaine, marijuana, major depression presented with redness and abscess to right lower extremity for about 2 weeks. Patient reported injecting in her Leg. In the ER, I&D was done and admitted for IV antibiotics.   Assessment & Plan: # Cellulitis and abscess of right lower extremity: - Area of infection was marked upon admission and infected site has decreased in size. States area still painful but improving. - WBC count WNL, awaiting blood culture results - Continue Vancomycin. If patient continues to improve and after culture data we will decide on long-term antibiotics. -HIV and hepatitis panel negative -No murmur on physical exam.  # Polysubstance abuse/opiates dependence:  - Pharmacy confirmed methadone dosing of 60mg  po daily with methadone clinic - Recommended outpatient follow-up at detox program. Education provided to the patient.  DVT prophylaxis: Heparin Code Status: Full Family Communication: No family at bedside Disposition Plan: Remain inpatient today, likely discharge home in 1-2 days. I discussed with the social worker regarding safe discharge plan.   Consultants:   None  Procedures:   I&D of right lower extremity in the ER  Antimicrobials:  Vancomycin IV 750 mg/150 mL q 12 hours since June 7.   Subjective: Patient states she did not sleep well last night but is otherwise comfortable. Her pain is improving. Denies chest pain or shortness of breath.  Objective: Vitals:   04/15/17 1635 04/15/17 1823 04/15/17 2116 04/16/17 0546  BP: 114/81 128/78 99/67 105/65  Pulse:  (!) 55 63 (!) 53  Resp:  16 18 16   Temp:  97.6 F (36.4 C) 98.3 F (36.8 C) 97.8 F (36.6 C)  TempSrc:  Oral Oral   SpO2:  99% 99% 100%  Weight:      Height:         Intake/Output Summary (Last 24 hours) at 04/16/17 1046 Last data filed at 04/16/17 0600  Gross per 24 hour  Intake           1354.5 ml  Output                0 ml  Net           1354.5 ml   Filed Weights   04/15/17 1136  Weight: 59 kg (130 lb)    Examination:  General exam: Appears calm and comfortable  Respiratory system: Clear to auscultation. Respiratory effort normal. Cardiovascular system: S1 & S2 heard, RRR. No JVD, murmurs, rubs, gallops or clicks. No pedal edema. Gastrointestinal system: Abdomen is nondistended, soft and nontender. No organomegaly or masses felt. Normal bowel sounds heard. Central nervous system: Alert and oriented. No focal neurological deficits. Skin: Mild erythema affected the infected lower right extremity. Size of infected area has decreased from initial marker tracings. Dressing applied. Psychiatry: Judgement and insight appear normal. Mood & affect appropriate.     Data Reviewed: I have personally reviewed following labs and imaging studies  CBC:  Recent Labs Lab 04/15/17 1151 04/16/17 0606  WBC 5.0 3.4*  NEUTROABS 3.0 1.4*  HGB 13.3 11.3*  HCT 39.3 34.3*  MCV 86.4 86.6  PLT 135* PENDING   Basic Metabolic Panel:  Recent Labs Lab 04/15/17 1151 04/16/17 0606  NA 137 140  K 3.7 3.6  CL 103 105  CO2 23 26  GLUCOSE 100* 97  BUN  10 12  CREATININE 0.72 0.83  CALCIUM 9.3 8.7*   GFR: Estimated Creatinine Clearance: 73.8 mL/min (by C-G formula based on SCr of 0.83 mg/dL). Liver Function Tests:  Recent Labs Lab 04/15/17 1151 04/16/17 0606  AST 49* 30  ALT 66* 42  ALKPHOS 156* 122  BILITOT 0.8 0.4  PROT 7.7 6.1*  ALBUMIN 3.7 3.0*   No results for input(s): LIPASE, AMYLASE in the last 168 hours. No results for input(s): AMMONIA in the last 168 hours. Coagulation Profile: No results for input(s): INR, PROTIME in the last 168 hours. Cardiac Enzymes: No results for input(s): CKTOTAL, CKMB, CKMBINDEX, TROPONINI in the  last 168 hours. BNP (last 3 results) No results for input(s): PROBNP in the last 8760 hours. HbA1C: No results for input(s): HGBA1C in the last 72 hours. CBG: No results for input(s): GLUCAP in the last 168 hours. Lipid Profile: No results for input(s): CHOL, HDL, LDLCALC, TRIG, CHOLHDL, LDLDIRECT in the last 72 hours. Thyroid Function Tests: No results for input(s): TSH, T4TOTAL, FREET4, T3FREE, THYROIDAB in the last 72 hours. Anemia Panel: No results for input(s): VITAMINB12, FOLATE, FERRITIN, TIBC, IRON, RETICCTPCT in the last 72 hours. Sepsis Labs:  Recent Labs Lab 04/15/17 1206  LATICACIDVEN 1.32    Recent Results (from the past 240 hour(s))  Wound or Superficial Culture     Status: None (Preliminary result)   Collection Time: 04/15/17  3:25 PM  Result Value Ref Range Status   Specimen Description ABSCESS RESISTANT LEG  Final   Special Requests NONE  Final   Gram Stain   Final    FEW WBC PRESENT, PREDOMINANTLY PMN MODERATE GRAM POSITIVE COCCI    Culture PENDING  Incomplete   Report Status PENDING  Incomplete  Culture, blood (Routine X 2) w Reflex to ID Panel     Status: None (Preliminary result)   Collection Time: 04/15/17  6:58 PM  Result Value Ref Range Status   Specimen Description BLOOD RIGHT FOREARM  Final   Special Requests IN PEDIATRIC BOTTLE Blood Culture adequate volume  Final   Culture NO GROWTH < 12 HOURS  Final   Report Status PENDING  Incomplete  Culture, blood (Routine X 2) w Reflex to ID Panel     Status: None (Preliminary result)   Collection Time: 04/15/17  6:58 PM  Result Value Ref Range Status   Specimen Description BLOOD LEFT FOREARM  Final   Special Requests IN PEDIATRIC BOTTLE Blood Culture adequate volume  Final   Culture NO GROWTH < 12 HOURS  Final   Report Status PENDING  Incomplete         Radiology Studies: No results found.      Scheduled Meds: . folic acid  1 mg Oral Daily  . heparin  5,000 Units Subcutaneous Q8H  .  LORazepam  0-4 mg Oral Q6H   Followed by  . [START ON 04/17/2017] LORazepam  0-4 mg Oral Q12H  . methadone  60 mg Oral Daily  . multivitamin with minerals  1 tablet Oral Daily  . thiamine  100 mg Oral Daily   Or  . thiamine  100 mg Intravenous Daily   Continuous Infusions: . sodium chloride 75 mL/hr at 04/16/17 0359  . vancomycin       LOS: 0 days    Time spent: 25 min.    Willa Frater, PA-S [facility] Pager [phone]  If 7PM-7AM, please contact night-coverage [email or web address] Password [email or web address] 04/16/2017, 10:46 AM

## 2017-04-16 NOTE — Consult Note (Addendum)
WOC Nurse wound consult note Reason for Consult: Consult requested for right leg wound Wound type: Full thickness wound; I&D was performed previously for an abscess Measurement: 1X.3X.3cm, dark red wound bed, small amt pink drainage, no odor Periwound: Generalized edema and erythremia surrounding wound; appears to be receeding from previously marked outline, extends to approx 4 cm surrounding the wound. Dressing procedure/placement/frequency: Iodoform packing to absorb drainage and promote healing.  Demonstrated procedure to patient who verbalized understanding.  Foam dressing over wound to protect from further injury.  Please re-consult if further assistance is needed.  Thank-you,  Cammie Mcgeeawn Nekesha Font MSN, RN, CWOCN, [place]AP, CNS [phone]

## 2017-04-17 LAB — AEROBIC CULTURE W GRAM STAIN (SUPERFICIAL SPECIMEN)

## 2017-04-17 MED ORDER — DOXYCYCLINE HYCLATE 50 MG PO CAPS: 50.0000 mg | ORAL_CAPSULE | Freq: Two times a day (BID) | ORAL | 0 refills | Status: AC

## 2017-04-17 MED ORDER — IBUPROFEN 800 MG PO TABS: 800.0000 mg | ORAL_TABLET | Freq: Three times a day (TID) | ORAL | 0 refills | Status: AC | PRN

## 2017-04-17 MED ORDER — ACETAMINOPHEN 325 MG PO TABS: 650.0000 mg | ORAL_TABLET | Freq: Four times a day (QID) | ORAL | Status: AC | PRN

## 2017-04-17 NOTE — Discharge Summary (Signed)
Physician Discharge Summary  [name] NWG:[id number] DOB: 04-11-1976 DOA: 04/15/2017  PCP: System, Provider Not In  Admit date: 04/15/2017 Discharge date: 04/17/2017  Admitted From:home Disposition:home  Recommendations for Outpatient Follow-up:  1. Follow up with PCP in 1-2 weeks 2. Please obtain BMP/CBC in one week   Home Health:RN Equipment/Devices:none Discharge Condition:stable CODE STATUS:full code Diet recommendation:heart healthy  Brief/Interim Summary: 41 y/o female with polysubstance abuse including opiates on methadone, cocaine, marijuana, major depression presented with redness and abscess to right lower extremity for about 2 weeks. Patient reported injecting in her Leg. In the ER, I&D was done and admitted for IV antibiotics.  # Cellulitis and abscess of right lower extremity: -Status post I&D in the emergency. Treated with IV vancomycin with significant clinical improvement. Surrounding erythema improved. The wound culture growing staph and patient has nasal mucosa MRSA screen positive. Blood culture negative. Patient is afebrile and has no leukocytosis. Plan to discharge home with 10 days of oral doxycycline. Home care nurse ordered to help out with dressing change. Patient was evaluated by wound care nurse who recommended to do dressing changes with iodoform packing to observe drainage and promote healing. Patient stated that she learned the dressing change technique. -HIV and hepatitis panel negative -No murmur on physical exam.  # Polysubstance abuse/opiates dependence:  - Pharmacy confirmed methadone dosing of 60mg  po daily with methadone clinic - Education provided to the patient. She reported that she is going to inpatient rehabilitation program.   Discharge Diagnoses:  Principal Problem:   Cellulitis and abscess of right leg Active Problems:   Opioid dependence (HCC)   Cellulitis   Polysubstance abuse   Wound infection    Discharge  Instructions  Discharge Instructions    Call MD for:  difficulty breathing, headache or visual disturbances    Complete by:  As directed    Call MD for:  extreme fatigue    Complete by:  As directed    Call MD for:  hives    Complete by:  As directed    Call MD for:  persistant dizziness or light-headedness    Complete by:  As directed    Call MD for:  persistant nausea and vomiting    Complete by:  As directed    Call MD for:  severe uncontrolled pain    Complete by:  As directed    Call MD for:  temperature >100.4    Complete by:  As directed    Diet - low sodium heart healthy    Complete by:  As directed    Increase activity slowly    Complete by:  As directed      Allergies as of 04/17/2017   No Known Allergies     Medication List    TAKE these medications   acetaminophen 325 MG tablet Commonly known as:  TYLENOL Take 2 tablets (650 mg total) by mouth every 6 (six) hours as needed for mild pain (or Fever >/= 101).   doxycycline 50 MG capsule Commonly known as:  VIBRAMYCIN Take 1 capsule (50 mg total) by mouth 2 (two) times daily.   ibuprofen 800 MG tablet Commonly known as:  ADVIL,MOTRIN Take 1 tablet (800 mg total) by mouth 3 (three) times daily as needed for moderate pain.   methadone 10 MG/ML solution Commonly known as:  DOLOPHINE Take 60 mg by mouth daily.      Follow-up Information    [facility] Follow up on 04/20/2017.  Why:  10:30 for hospital follow up Contact information: [address] [place] [address] [phone]         No Known Allergies  Consultations: None  Procedures/Studies: I&D in ER  Subjective: Seen and examined at bedside. Reported feeling good. Pain is improved. Denied headache, dizziness, nausea, vomiting, chest pain or shortness of breath.  Discharge Exam: Vitals:   04/16/17 2109 04/17/17 0458  BP: 104/75 105/76  Pulse: (!) 52 60  Resp: 18 19  Temp: 97.9 F  (36.6 C) 97.7 F (36.5 C)   Vitals:   04/16/17 0546 04/16/17 1400 04/16/17 2109 04/17/17 0458  BP: 105/65 107/89 104/75 105/76  Pulse: (!) 53 61 (!) 52 60  Resp: 16 20 18 19   Temp: 97.8 F (36.6 C) 97.8 F (36.6 C) 97.9 F (36.6 C) 97.7 F (36.5 C)  TempSrc:  Oral Oral Oral  SpO2: 100% 98% 99% 100%  Weight:      Height:        General: Pt is alert, awake, not in acute distress Cardiovascular: RRR, S1/S2 +, no rubs, no gallops Respiratory: CTA bilaterally, no wheezing, no rhonchi Abdominal: Soft, NT, ND, bowel sounds + Extremities: no edema, no cyanosis. No erythema. She has small open wound on right lower extremity with dressing applied.    The results of significant diagnostics from this hospitalization (including imaging, microbiology, ancillary and laboratory) are listed below for reference.     Microbiology: Recent Results (from the past 240 hour(s))  Wound or Superficial Culture     Status: None (Preliminary result)   Collection Time: 04/15/17  3:25 PM  Result Value Ref Range Status   Specimen Description ABSCESS RESISTANT LEG  Final   Special Requests NONE  Final   Gram Stain   Final    FEW WBC PRESENT, PREDOMINANTLY PMN MODERATE GRAM POSITIVE COCCI    Culture   Final    MODERATE STAPHYLOCOCCUS AUREUS SUSCEPTIBILITIES TO FOLLOW    Report Status PENDING  Incomplete  Culture, blood (Routine X 2) w Reflex to ID Panel     Status: None (Preliminary result)   Collection Time: 04/15/17  6:58 PM  Result Value Ref Range Status   Specimen Description BLOOD RIGHT FOREARM  Final   Special Requests IN PEDIATRIC BOTTLE Blood Culture adequate volume  Final   Culture NO GROWTH < 12 HOURS  Final   Report Status PENDING  Incomplete  Culture, blood (Routine X 2) w Reflex to ID Panel     Status: None (Preliminary result)   Collection Time: 04/15/17  6:58 PM  Result Value Ref Range Status   Specimen Description BLOOD LEFT FOREARM  Final   Special Requests IN PEDIATRIC  BOTTLE Blood Culture adequate volume  Final   Culture NO GROWTH < 12 HOURS  Final   Report Status PENDING  Incomplete  MRSA PCR Screening     Status: Abnormal   Collection Time: 04/16/17  1:35 PM  Result Value Ref Range Status   MRSA by PCR POSITIVE (A) NEGATIVE Final    Comment:        The GeneXpert MRSA Assay (FDA approved for NASAL specimens only), is one component of a comprehensive MRSA colonization surveillance program. It is not intended to diagnose MRSA infection nor to guide or monitor treatment for MRSA infections. RESULT CALLED TO, READ BACK BY AND VERIFIED WITH: K BECKNER,RN AT 1523 04/16/17 BY L BENFIELD      Labs: BNP (last 3 results) No results for input(s):  BNP in the last 8760 hours. Basic Metabolic Panel:  Recent Labs Lab 04/15/17 1151 04/16/17 0606  NA 137 140  K 3.7 3.6  CL 103 105  CO2 23 26  GLUCOSE 100* 97  BUN 10 12  CREATININE 0.72 0.83  CALCIUM 9.3 8.7*   Liver Function Tests:  Recent Labs Lab 04/15/17 1151 04/16/17 0606  AST 49* 30  ALT 66* 42  ALKPHOS 156* 122  BILITOT 0.8 0.4  PROT 7.7 6.1*  ALBUMIN 3.7 3.0*   No results for input(s): LIPASE, AMYLASE in the last 168 hours. No results for input(s): AMMONIA in the last 168 hours. CBC:  Recent Labs Lab 04/15/17 1151 04/16/17 0606  WBC 5.0 3.4*  NEUTROABS 3.0 1.4*  HGB 13.3 11.3*  HCT 39.3 34.3*  MCV 86.4 86.6  PLT 135* 114*   Cardiac Enzymes: No results for input(s): CKTOTAL, CKMB, CKMBINDEX, TROPONINI in the last 168 hours. BNP: Invalid input(s): POCBNP CBG: No results for input(s): GLUCAP in the last 168 hours. D-Dimer No results for input(s): DDIMER in the last 72 hours. Hgb A1c No results for input(s): HGBA1C in the last 72 hours. Lipid Profile No results for input(s): CHOL, HDL, LDLCALC, TRIG, CHOLHDL, LDLDIRECT in the last 72 hours. Thyroid function studies No results for input(s): TSH, T4TOTAL, T3FREE, THYROIDAB in the last 72 hours.  Invalid input(s):  FREET3 Anemia work up No results for input(s): VITAMINB12, FOLATE, FERRITIN, TIBC, IRON, RETICCTPCT in the last 72 hours. Urinalysis No results found for: COLORURINE, APPEARANCEUR, LABSPEC, PHURINE, GLUCOSEU, HGBUR, BILIRUBINUR, KETONESUR, PROTEINUR, UROBILINOGEN, NITRITE, LEUKOCYTESUR Sepsis Labs Invalid input(s): PROCALCITONIN,  WBC,  LACTICIDVEN Microbiology Recent Results (from the past 240 hour(s))  Wound or Superficial Culture     Status: None (Preliminary result)   Collection Time: 04/15/17  3:25 PM  Result Value Ref Range Status   Specimen Description ABSCESS RESISTANT LEG  Final   Special Requests NONE  Final   Gram Stain   Final    FEW WBC PRESENT, PREDOMINANTLY PMN MODERATE GRAM POSITIVE COCCI    Culture   Final    MODERATE STAPHYLOCOCCUS AUREUS SUSCEPTIBILITIES TO FOLLOW    Report Status PENDING  Incomplete  Culture, blood (Routine X 2) w Reflex to ID Panel     Status: None (Preliminary result)   Collection Time: 04/15/17  6:58 PM  Result Value Ref Range Status   Specimen Description BLOOD RIGHT FOREARM  Final   Special Requests IN PEDIATRIC BOTTLE Blood Culture adequate volume  Final   Culture NO GROWTH < 12 HOURS  Final   Report Status PENDING  Incomplete  Culture, blood (Routine X 2) w Reflex to ID Panel     Status: None (Preliminary result)   Collection Time: 04/15/17  6:58 PM  Result Value Ref Range Status   Specimen Description BLOOD LEFT FOREARM  Final   Special Requests IN PEDIATRIC BOTTLE Blood Culture adequate volume  Final   Culture NO GROWTH < 12 HOURS  Final   Report Status PENDING  Incomplete  MRSA PCR Screening     Status: Abnormal   Collection Time: 04/16/17  1:35 PM  Result Value Ref Range Status   MRSA by PCR POSITIVE (A) NEGATIVE Final    Comment:        The GeneXpert MRSA Assay (FDA approved for NASAL specimens only), is one component of a comprehensive MRSA colonization surveillance program. It is not intended to diagnose MRSA infection  nor to guide or monitor treatment for MRSA infections. RESULT  CALLED TO, READ BACK BY AND VERIFIED WITH: K BECKNER,RN AT 1523 04/16/17 BY L BENFIELD      Time coordinating discharge: 26 minutes  SIGNED:   Maxie Barb, MD  [facility] 04/17/2017, 10:23 AM  If 7PM-7AM, please contact night-coverage [email or web address] Password [email or web address]

## 2017-04-17 NOTE — Progress Notes (Signed)
CM agave pt MATCH letter with list of participating and pt verbalized understanding of all parameters of MATCH.  CSW has been notified to provide bus passes. NO other CM needs were communicated.

## 2017-04-17 NOTE — Care Management Note (Signed)
Case Management Note  Patient Details  Name: Margaret [name] MRN: [id number] Date of Birth: [birth year]  Subjective/Objective:   41 y.o. F to be discharged with Dressing changes that she reports she can complete on her own. Pt also has NO Insurance so any HH would be charity care.                  Action/Plan:CM will sign off for now but will be available should additional discharge needs arise or disposition change.    Expected Discharge Date:  04/17/17               Expected Discharge Plan:  Home/Self Care  In-House Referral:     Discharge planning Services  CM Consult, Medication Assistance  Post Acute Care Choice:    Choice offered to:     DME Arranged:    DME Agency:     HH Arranged:    HH Agency:     Status of Service:  Completed, signed off  If discussed at [employer] Length of Stay Meetings, dates discussed:    Additional Comments:  Yvone NeuCrutchfield, Matheau Orona M, RN 04/17/2017, 2:46 PM

## 2017-04-17 NOTE — Progress Notes (Signed)
Clinical Social Worker was contacted to assist patient with bus passes. CSW gave  nurse bus pass. CSW signing off  Marrianne Moodshley Victoriah Wilds, MSW,  [place] [phone]

## 2017-04-17 NOTE — Progress Notes (Addendum)
Patient has match letter and was given bus pass. Prescriptions given to patient. Patient discharged.

## 2017-04-17 NOTE — Progress Notes (Signed)
Patient was discharged to home. AVS was reviewed an a copy was given to patient along with some dressing supplies. Spoke to [name], Sports coachcase manager and we are waiting for her to bring match letter and bus ticket for her ride home. Upon receiving those,  patient can be released. Charge nurse, [name] is aware.  [name], [name] n 04/17/17 3:03 PM

## 2017-04-20 ENCOUNTER — Inpatient Hospital Stay: Payer: Self-pay

## 2017-04-20 LAB — CULTURE, BLOOD (ROUTINE X 2)
CULTURE: NO GROWTH
CULTURE: NO GROWTH
SPECIAL REQUESTS: ADEQUATE
Special Requests: ADEQUATE

## 2017-04-20 NOTE — Progress Notes (Deleted)
Patient ID: Margaret [name], female   DOB: October 21, 1976, 41 y.o.   MRN: [id number] After recent hospitalization 6/7-04/17/2017 for cellulitis RLE.  +h/o polysubstance abuse with IVDU.    From d/c summary: 41 y/o female with polysubstance abuse including opiates on methadone, cocaine, marijuana,major depression presented with redness and abscess to right lower extremity for about 2 weeks. Patient reported injecting in her Leg. In the ER, I&D was done and admitted for IV antibiotics.  # Cellulitis and abscess of rightlower extremity: -Status post I&D in the emergency. Treated with IV vancomycin with significant clinical improvement. Surrounding erythema improved. The wound culture growing staph and patient has nasal mucosa MRSA screen positive. Blood culture negative. Patient is afebrile and has no leukocytosis. Plan to discharge home with 10 days of oral doxycycline. Home care nurse ordered to help out with dressing change. Patient was evaluated by wound care nurse who recommended to do dressing changes with iodoform packing to observe drainage and promote healing. Patient stated that she learned the dressing change technique. -HIV and hepatitis panel negative -No murmur on physical exam.  # Polysubstance abuse/opiates dependence:  - Pharmacy confirmed methadone dosing of 60mg  po daily with methadone clinic - Education provided to the patient. She reported that she is going to inpatient rehabilitation program.   Discharge Diagnoses:  Principal Problem:   Cellulitis and abscess of right leg Active Problems:   Opioid dependence (HCC)   Cellulitis   Polysubstance abuse   Wound infection

## 2017-07-27 ENCOUNTER — Emergency Department (HOSPITAL_COMMUNITY): Admission: EM | Admit: 2017-07-27 | Discharge: 2017-07-27 | Discharge status: Against Medical Advice | Payer: Self-pay

## 2017-07-27 NOTE — ED Notes (Signed)
Called for triage no response 

## 2017-07-30 ENCOUNTER — Encounter (HOSPITAL_COMMUNITY): Payer: Self-pay

## 2017-07-30 ENCOUNTER — Emergency Department (HOSPITAL_COMMUNITY)
Admission: EM | Admit: 2017-07-30 | Discharge: 2017-07-30 | Disposition: A | Discharge status: Home / Self Care | Payer: Self-pay | Attending: Emergency Medicine | Admitting: Emergency Medicine

## 2017-07-30 DIAGNOSIS — I10 Essential (primary) hypertension: Secondary | ICD-10-CM | POA: Insufficient documentation

## 2017-07-30 DIAGNOSIS — K0889 Other specified disorders of teeth and supporting structures: Secondary | ICD-10-CM | POA: Insufficient documentation

## 2017-07-30 DIAGNOSIS — Z87891 Personal history of nicotine dependence: Secondary | ICD-10-CM | POA: Insufficient documentation

## 2017-07-30 DIAGNOSIS — Z79899 Other long term (current) drug therapy: Secondary | ICD-10-CM | POA: Insufficient documentation

## 2017-07-30 MED ORDER — PENICILLIN V POTASSIUM 500 MG PO TABS: 500.0000 mg | ORAL_TABLET | Freq: Four times a day (QID) | ORAL | 0 refills | Status: AC

## 2017-07-30 NOTE — Discharge Instructions (Addendum)
The [facility] on [address] is having a free dental clinic for the uninsured.  It is Friday sept 28th and sat September 29th from 9am to 5pm.    Please take Ibuprofen (Advil, motrin) and Tylenol (acetaminophen) to relieve your pain.  You may take up to 600 MG (3 pills) of normal strength ibuprofen every 8 hours as needed.  In between doses of ibuprofen you make take tylenol, up to 1,000 mg (two extra strength pills).  Do not take more than 3,000 mg tylenol in a 24 hour period.  Please check all medication labels as many medications such as pain and cold medications may contain tylenol.  Do not drink alcohol while taking these medications.  Do not take other NSAID'S while taking ibuprofen (such as aleve or naproxen).  Please take ibuprofen with food to decrease stomach upset.  You may have diarrhea from the antibiotics.  It is very important that you continue to take the antibiotics even if you get diarrhea unless a medical professional tells you that you may stop taking them.  If you stop too early the bacteria you are being treated for will become stronger and you may need different, more powerful antibiotics that have more side effects and worsening diarrhea.  Please stay well hydrated and consider probiotics as they may decrease the severity of your diarrhea.  Please be aware that if you take any hormonal contraception (birth control pills, nexplanon, the ring, etc) that your birth control will not work while you are taking antibiotics and you need to use back up protection as directed on the birth control medication information insert.

## 2017-07-30 NOTE — ED Notes (Signed)
Pt ambulatory and independent at discharge.  Verbalized understanding of discharge instructions 

## 2017-07-30 NOTE — ED Provider Notes (Signed)
[facility]-EMERGENCY DEPT Provider Note   CSN: [id number] Arrival date & time: 07/30/17  4098     History   Chief Complaint Chief Complaint  Patient presents with  . Dental Pain  . Oral Swelling    HPI Margaret [name] is a 41 y.o. female with a history of polysubstance abuse, opioid dependence, who presents today for evaluation of left upper dental pain and swelling for the past 5 days.  She reports that her tetanus is up-to-date. She has a broken tooth apparently area where she is having pain. She reports that her pain is controlled with ibuprofen and Tylenol.  She does not have a dentist. No fevers/chills.  HPI  Past Medical History:  Diagnosis Date  . Drug use   . Heart murmur   . Hypertension     Patient Active Problem List   Diagnosis Date Noted  . Cellulitis 04/15/2017  . Polysubstance abuse 04/15/2017  . Cellulitis and abscess of right leg 04/15/2017  . Wound infection 04/15/2017  . Opioid dependence (HCC) 10/04/2012  . Major depression 10/04/2012    Past Surgical History:  Procedure Laterality Date  . TUBAL LIGATION      OB History    No data available       Home Medications    Prior to Admission medications   Medication Sig Start Date End Date Taking? Authorizing Provider  acetaminophen (TYLENOL) 325 MG tablet Take 2 tablets (650 mg total) by mouth every 6 (six) hours as needed for mild pain (or Fever >/= 101). 04/17/17   Maxie Barb, MD  ibuprofen (ADVIL,MOTRIN) 800 MG tablet Take 1 tablet (800 mg total) by mouth 3 (three) times daily as needed for moderate pain. 04/17/17   Maxie Barb, MD  methadone (DOLOPHINE) 10 MG/ML solution Take 60 mg by mouth daily.    [provider]  penicillin v potassium (VEETID) 500 MG tablet Take 1 tablet (500 mg total) by mouth 4 (four) times daily. 07/30/17 08/06/17  Cristina Gong, PA-C    Family History No family history on file.  Social History Social History  Substance Use Topics    . Smoking status: Former Games developer  . Smokeless tobacco: Never Used  . Alcohol use No     Allergies   Patient has no known allergies.   Review of Systems Review of Systems  Constitutional: Negative for chills, fatigue and fever.  HENT: Positive for dental problem. Negative for drooling.      Physical Exam Updated Vital Signs BP 138/85 (BP Location: Left Arm)   Pulse 69   Temp 98.9 F (37.2 C) (Oral)   Resp 16   Ht 5' (1.524 m)   Wt 61.2 kg (135 lb)   SpO2 99%   BMI 26.37 kg/m   Physical Exam  Constitutional: She appears well-developed and well-nourished.  HENT:  Head: Normocephalic and atraumatic.  Mouth/Throat: Uvula is midline and oropharynx is clear and moist. No oral lesions. Abnormal dentition. Dental caries present. No dental abscesses. No oropharyngeal exudate.  Tooth #6 is broken. There is no elevation to the floor the mouth. There is no obvious swelling or fluctuance. No obvious abscess. There is no obvious facial swelling.  Neck: Normal range of motion. Neck supple.  Pulmonary/Chest: Effort normal. No respiratory distress.  Lymphadenopathy:    She has no cervical adenopathy.  Skin: Skin is warm and dry. She is not diaphoretic.  Multiple linear scars on bilateral hands, patient states are track marks.   Psychiatric: She  has a normal mood and affect. Her behavior is normal.  Nursing note and vitals reviewed.    ED Treatments / Results  Labs (all labs ordered are listed, but only abnormal results are displayed) Labs Reviewed - No data to display  EKG  EKG Interpretation None       Radiology No results found.  Procedures Procedures (including critical care time)  Medications Ordered in ED Medications - No data to display   Initial Impression / Assessment and Plan / ED Course  I have reviewed the triage vital signs and the nursing notes.  Pertinent labs & imaging results that were available during my care of the patient were reviewed by me  and considered in my medical decision making (see chart for details).    Patient with toothache.  No gross abscess.  Exam unconcerning for Ludwig's angina or spread of infection.  Will treat with penicillin and pain medicine.  Urged patient to follow-up with dentist.     Final Clinical Impressions(s) / ED Diagnoses   Final diagnoses:  Pain, dental    New Prescriptions New Prescriptions   PENICILLIN V POTASSIUM (VEETID) 500 MG TABLET    Take 1 tablet (500 mg total) by mouth 4 (four) times daily.     Cristina Gong, PA-C 07/30/17 1356    Linwood Dibbles, MD 07/30/17 [phone]

## 2017-07-30 NOTE — ED Triage Notes (Signed)
Patient c/o left upper dental pain and swelling x 5 days.

## 2017-09-02 NOTE — Congregational Nurse Program (Signed)
Congregational Nurse Program Note  Date of Encounter: 08/20/2017  Past Medical History: Past Medical History:  Diagnosis Date  . Drug use   . Heart murmur   . Hypertension     Encounter Details:     CNP Questionnaire - 08/20/17 1727      Patient Demographics   Is this a new or existing patient? New   Patient is considered a/an Not Applicable   Race Caucasian/White     Patient Assistance   Location of Patient Assistance Not Applicable   Patient's financial/insurance status Low Income;Self-Pay (Uninsured)   Uninsured Patient ([place] [employer] Connects) Yes   Interventions Assisted patient in making appt.;Counseled to make appt. with provider;Appt. has been completed   Patient referred to apply for the following financial assistance Not Applicable   Food insecurities addressed Not Applicable   Transportation assistance No   Assistance securing medications No   Educational health offerings Behavioral health;Navigating the healthcare system;Other     Encounter Details   Primary purpose of visit Navigating the Healthcare System   Was an Emergency Department visit averted? Not Applicable   Does patient have a medical provider? No   Patient referred to Area Agency;Clinic   Was a mental health screening completed? (GAINS tool) No   Does patient have dental issues? No   Does patient have vision issues? No   Does your patient have an abnormal blood pressure today? No   Since previous encounter, have you referred patient for abnormal blood pressure that resulted in a new diagnosis or medication change? No   Does your patient have an abnormal blood glucose today? No   Since previous encounter, have you referred patient for abnormal blood glucose that resulted in a new diagnosis or medication change? No   Was there a life-saving intervention made? No     [facility], but needs a medical clearance.  States she was dx many years ago with a "thyroid problem", but has  never been prescribed medication and states has no symptoms.  Assisted with seeing [facility] for [facility]

## 2017-10-23 ENCOUNTER — Inpatient Hospital Stay (HOSPITAL_COMMUNITY)
Admission: EM | Admit: 2017-10-23 | Discharge: 2017-10-30 | DRG: 602 | Disposition: A | Discharge status: Home / Self Care | Payer: Self-pay | Attending: Family Medicine | Admitting: Family Medicine

## 2017-10-23 ENCOUNTER — Other Ambulatory Visit: Payer: Self-pay

## 2017-10-23 ENCOUNTER — Encounter (HOSPITAL_COMMUNITY): Payer: Self-pay

## 2017-10-23 ENCOUNTER — Emergency Department (HOSPITAL_COMMUNITY): Payer: Self-pay

## 2017-10-23 DIAGNOSIS — I76 Septic arterial embolism: Secondary | ICD-10-CM

## 2017-10-23 DIAGNOSIS — F329 Major depressive disorder, single episode, unspecified: Secondary | ICD-10-CM | POA: Diagnosis present

## 2017-10-23 DIAGNOSIS — R011 Cardiac murmur, unspecified: Secondary | ICD-10-CM | POA: Diagnosis present

## 2017-10-23 DIAGNOSIS — R079 Chest pain, unspecified: Secondary | ICD-10-CM

## 2017-10-23 DIAGNOSIS — L03114 Cellulitis of left upper limb: Principal | ICD-10-CM | POA: Diagnosis present

## 2017-10-23 DIAGNOSIS — F1123 Opioid dependence with withdrawal: Secondary | ICD-10-CM | POA: Diagnosis not present

## 2017-10-23 DIAGNOSIS — R609 Edema, unspecified: Secondary | ICD-10-CM

## 2017-10-23 DIAGNOSIS — R600 Localized edema: Secondary | ICD-10-CM | POA: Diagnosis present

## 2017-10-23 DIAGNOSIS — D649 Anemia, unspecified: Secondary | ICD-10-CM | POA: Diagnosis present

## 2017-10-23 DIAGNOSIS — H65193 Other acute nonsuppurative otitis media, bilateral: Secondary | ICD-10-CM

## 2017-10-23 DIAGNOSIS — I10 Essential (primary) hypertension: Secondary | ICD-10-CM | POA: Diagnosis present

## 2017-10-23 DIAGNOSIS — Z818 Family history of other mental and behavioral disorders: Secondary | ICD-10-CM

## 2017-10-23 DIAGNOSIS — R740 Nonspecific elevation of levels of transaminase and lactic acid dehydrogenase [LDH]: Secondary | ICD-10-CM

## 2017-10-23 DIAGNOSIS — Z8249 Family history of ischemic heart disease and other diseases of the circulatory system: Secondary | ICD-10-CM

## 2017-10-23 DIAGNOSIS — E876 Hypokalemia: Secondary | ICD-10-CM | POA: Diagnosis present

## 2017-10-23 DIAGNOSIS — F1120 Opioid dependence, uncomplicated: Secondary | ICD-10-CM | POA: Diagnosis present

## 2017-10-23 DIAGNOSIS — M545 Low back pain: Secondary | ICD-10-CM | POA: Diagnosis present

## 2017-10-23 DIAGNOSIS — L03116 Cellulitis of left lower limb: Secondary | ICD-10-CM | POA: Diagnosis present

## 2017-10-23 DIAGNOSIS — Z590 Homelessness: Secondary | ICD-10-CM

## 2017-10-23 DIAGNOSIS — I2690 Septic pulmonary embolism without acute cor pulmonale: Secondary | ICD-10-CM | POA: Diagnosis present

## 2017-10-23 DIAGNOSIS — L03115 Cellulitis of right lower limb: Secondary | ICD-10-CM | POA: Diagnosis present

## 2017-10-23 DIAGNOSIS — R7401 Elevation of levels of liver transaminase levels: Secondary | ICD-10-CM

## 2017-10-23 DIAGNOSIS — L02611 Cutaneous abscess of right foot: Secondary | ICD-10-CM | POA: Diagnosis present

## 2017-10-23 DIAGNOSIS — Z87891 Personal history of nicotine dependence: Secondary | ICD-10-CM

## 2017-10-23 DIAGNOSIS — I081 Rheumatic disorders of both mitral and tricuspid valves: Secondary | ICD-10-CM | POA: Diagnosis present

## 2017-10-23 DIAGNOSIS — L0390 Cellulitis, unspecified: Secondary | ICD-10-CM

## 2017-10-23 DIAGNOSIS — F1990 Other psychoactive substance use, unspecified, uncomplicated: Secondary | ICD-10-CM

## 2017-10-23 DIAGNOSIS — F1910 Other psychoactive substance abuse, uncomplicated: Secondary | ICD-10-CM | POA: Diagnosis present

## 2017-10-23 DIAGNOSIS — Z9851 Tubal ligation status: Secondary | ICD-10-CM

## 2017-10-23 HISTORY — DX: Major depressive disorder, single episode, unspecified: F32.9

## 2017-10-23 HISTORY — DX: Opioid dependence, uncomplicated: F11.20

## 2017-10-23 HISTORY — DX: Depression, unspecified: F32.A

## 2017-10-23 LAB — CBC WITH DIFFERENTIAL/PLATELET
Basophils Absolute: 0 10*3/uL (ref 0.0–0.1)
Basophils Relative: 0 %
Eosinophils Absolute: 0 10*3/uL (ref 0.0–0.7)
Eosinophils Relative: 0 %
HCT: 32.4 % — ABNORMAL LOW (ref 36.0–46.0)
Hemoglobin: 10.9 g/dL — ABNORMAL LOW (ref 12.0–15.0)
Lymphocytes Relative: 8 %
Lymphs Abs: 0.8 10*3/uL (ref 0.7–4.0)
MCH: 28.2 pg (ref 26.0–34.0)
MCHC: 33.6 g/dL (ref 30.0–36.0)
MCV: 83.9 fL (ref 78.0–100.0)
Monocytes Absolute: 0.5 10*3/uL (ref 0.1–1.0)
Monocytes Relative: 5 %
Neutro Abs: 9.2 10*3/uL — ABNORMAL HIGH (ref 1.7–7.7)
Neutrophils Relative %: 87 %
Platelets: 175 10*3/uL (ref 150–400)
RBC: 3.86 MIL/uL — ABNORMAL LOW (ref 3.87–5.11)
RDW: 14.8 % (ref 11.5–15.5)
WBC: 10.5 10*3/uL (ref 4.0–10.5)

## 2017-10-23 LAB — BASIC METABOLIC PANEL
Anion gap: 7 (ref 5–15)
BUN: 10 mg/dL (ref 6–20)
CO2: 25 mmol/L (ref 22–32)
Calcium: 8.8 mg/dL — ABNORMAL LOW (ref 8.9–10.3)
Chloride: 105 mmol/L (ref 101–111)
Creatinine, Ser: 0.6 mg/dL (ref 0.44–1.00)
GFR calc Af Amer: >60 mL/min (ref >60)
GFR calc non Af Amer: >60 mL/min (ref >60)
Glucose, Bld: 136 mg/dL — ABNORMAL HIGH (ref 65–99)
Potassium: 3 mmol/L — ABNORMAL LOW (ref 3.5–5.1)
Sodium: 137 mmol/L (ref 135–145)

## 2017-10-23 LAB — I-STAT CG4 LACTIC ACID, ED
Lactic Acid, Venous: 1.06 mmol/L (ref 0.5–1.9)
Lactic Acid, Venous: 0.52 mmol/L (ref 0.5–1.9)

## 2017-10-23 LAB — MAGNESIUM: Magnesium: 1.9 mg/dL (ref 1.7–2.4)

## 2017-10-23 MED ORDER — PIPERACILLIN-TAZOBACTAM 3.375 G IVPB 30 MIN
3.3750 g | Freq: Once | INTRAVENOUS | Status: AC
  Administered 2017-10-23: 3.375 g via INTRAVENOUS
  Filled 2017-10-23: qty 50

## 2017-10-23 MED ORDER — PIPERACILLIN-TAZOBACTAM 3.375 G IVPB 30 MIN: 3.3750 g | Freq: Once | INTRAVENOUS | Status: DC

## 2017-10-23 MED ORDER — AMOXICILLIN-POT CLAVULANATE 875-125 MG PO TABS: 1.0000 | ORAL_TABLET | Freq: Two times a day (BID) | ORAL | 0 refills | Status: DC

## 2017-10-23 MED ORDER — AMOXICILLIN 500 MG PO CAPS
500.0000 mg | ORAL_CAPSULE | Freq: Once | ORAL | Status: DC
Start: 2017-10-23 — End: 2017-10-23

## 2017-10-23 MED ORDER — ACETAMINOPHEN 500 MG PO TABS
1000.0000 mg | ORAL_TABLET | Freq: Once | ORAL | Status: AC
  Administered 2017-10-23: 1000 mg via ORAL
  Filled 2017-10-23: qty 2

## 2017-10-23 MED ORDER — VANCOMYCIN HCL IN DEXTROSE 1-5 GM/200ML-% IV SOLN
1000.0000 mg | Freq: Once | INTRAVENOUS | Status: AC
  Administered 2017-10-23: 1000 mg via INTRAVENOUS
  Filled 2017-10-23: qty 200

## 2017-10-23 MED ORDER — AMOXICILLIN-POT CLAVULANATE 875-125 MG PO TABS
1.0000 | ORAL_TABLET | Freq: Once | ORAL | Status: AC
  Administered 2017-10-23: 1 via ORAL
  Filled 2017-10-23: qty 1

## 2017-10-23 MED ORDER — TRAMADOL HCL 50 MG PO TABS
50.0000 mg | ORAL_TABLET | Freq: Four times a day (QID) | ORAL | Status: DC | PRN
  Administered 2017-10-24 – 2017-10-30 (×14): 50 mg via ORAL
  Filled 2017-10-23 (×14): qty 1

## 2017-10-23 MED ORDER — SODIUM CHLORIDE 0.9 % IV SOLN: 1000.0000 mL | INTRAVENOUS | Status: DC

## 2017-10-23 MED ORDER — ONDANSETRON HCL 4 MG PO TABS: 4.0000 mg | ORAL_TABLET | Freq: Four times a day (QID) | ORAL | Status: DC | PRN

## 2017-10-23 MED ORDER — VANCOMYCIN HCL IN DEXTROSE 1-5 GM/200ML-% IV SOLN: 1000.0000 mg | Freq: Once | INTRAVENOUS | Status: DC

## 2017-10-23 MED ORDER — ACETAMINOPHEN 325 MG PO TABS
650.0000 mg | ORAL_TABLET | Freq: Four times a day (QID) | ORAL | Status: DC | PRN
  Administered 2017-10-24 – 2017-10-30 (×4): 650 mg via ORAL
  Filled 2017-10-23 (×5): qty 2

## 2017-10-23 MED ORDER — FLUTICASONE PROPIONATE 50 MCG/ACT NA SUSP: 1.0000 | Freq: Every day | NASAL | 0 refills | Status: DC

## 2017-10-23 MED ORDER — ENOXAPARIN SODIUM 40 MG/0.4ML ~~LOC~~ SOLN
40.0000 mg | SUBCUTANEOUS | Status: DC
  Administered 2017-10-23 – 2017-10-29 (×7): 40 mg via SUBCUTANEOUS
  Filled 2017-10-23 (×7): qty 0.4

## 2017-10-23 MED ORDER — SODIUM CHLORIDE 0.9 % IV BOLUS (SEPSIS)
1000.0000 mL | Freq: Once | INTRAVENOUS | Status: AC
  Administered 2017-10-23: 1000 mL via INTRAVENOUS

## 2017-10-23 MED ORDER — DEXAMETHASONE SODIUM PHOSPHATE 10 MG/ML IJ SOLN
10.0000 mg | Freq: Once | INTRAMUSCULAR | Status: AC
  Administered 2017-10-23: 10 mg via INTRAVENOUS
  Filled 2017-10-23: qty 1

## 2017-10-23 MED ORDER — POTASSIUM CHLORIDE IN NACL 20-0.9 MEQ/L-% IV SOLN
INTRAVENOUS | Status: DC
  Administered 2017-10-25: 04:00:00 via INTRAVENOUS
  Filled 2017-10-23 (×6): qty 1000

## 2017-10-23 MED ORDER — IBUPROFEN 800 MG PO TABS: 800.0000 mg | ORAL_TABLET | Freq: Three times a day (TID) | ORAL | Status: DC | PRN

## 2017-10-23 MED ORDER — KETOROLAC TROMETHAMINE 30 MG/ML IJ SOLN
30.0000 mg | Freq: Once | INTRAMUSCULAR | Status: AC
  Administered 2017-10-23: 30 mg via INTRAVENOUS
  Filled 2017-10-23: qty 1

## 2017-10-23 MED ORDER — POTASSIUM CHLORIDE IN NACL 20-0.9 MEQ/L-% IV SOLN
INTRAVENOUS | Status: AC
  Administered 2017-10-23: 23:00:00 via INTRAVENOUS
  Filled 2017-10-23: qty 1000

## 2017-10-23 MED ORDER — ONDANSETRON HCL 4 MG/2ML IJ SOLN: 4.0000 mg | Freq: Four times a day (QID) | INTRAMUSCULAR | Status: DC | PRN

## 2017-10-23 NOTE — ED Provider Notes (Addendum)
[facility]-EMERGENCY DEPT Provider Note   CSN: [id number] Arrival date & time: 10/23/17  1558     History   Chief Complaint Chief Complaint  Patient presents with  . Sore Throat  . Fever    HPI Margaret [name] is a 41 y.o. female with history of polysubstance abuse, major depression, opioid dependence presents today with chief complaint acute onset, progressively worsening right ear pain, nasal congestion, and sore throat for 2 weeks.  She notes that last night she developed subjective fevers and chills which improved somewhat with ibuprofen.  She has not tried anything else for her symptoms.  She describes pain as aching and throbbing, worse with swallowing but denies throat tightness, drooling, or facial swelling.  Pain radiates from the left ear to the throat.  She denies cough, shortness of breath, chest pain, nausea, vomiting, headaches, neck pain, or abdominal pain.  She states that she is an IV drug user and is interested in finding out more information regarding detox.  She denies suicidal ideation, homicidal ideation, or auditory or visual hallucinations.  The history is provided by the patient.    Past Medical History:  Diagnosis Date  . Drug use   . Heart murmur   . Hypertension     Patient Active Problem List   Diagnosis Date Noted  . Cellulitis 04/15/2017  . Polysubstance abuse (HCC) 04/15/2017  . Cellulitis and abscess of right leg 04/15/2017  . Wound infection 04/15/2017  . Opioid dependence (HCC) 10/04/2012  . Major depression 10/04/2012    Past Surgical History:  Procedure Laterality Date  . TUBAL LIGATION      OB History    No data available       Home Medications    Prior to Admission medications   Medication Sig Start Date End Date Taking? Authorizing Provider  acetaminophen (TYLENOL) 325 MG tablet Take 2 tablets (650 mg total) by mouth every 6 (six) hours as needed for mild pain (or Fever >/= 101). 04/17/17    Maxie Barb, MD  ibuprofen (ADVIL,MOTRIN) 800 MG tablet Take 1 tablet (800 mg total) by mouth 3 (three) times daily as needed for moderate pain. 04/17/17   Maxie Barb, MD  methadone (DOLOPHINE) 10 MG/ML solution Take 60 mg by mouth daily.    [provider]    Family History History reviewed. No pertinent family history.  Social History Social History   Tobacco Use  . Smoking status: Former Games developer  . Smokeless tobacco: Never Used  Substance Use Topics  . Alcohol use: No  . Drug use: Yes    Types: Cocaine, Heroin, Other-see comments, Marijuana    Comment: Heroin     Allergies   Patient has no known allergies.   Review of Systems Review of Systems  Constitutional: Positive for chills and fever.  HENT: Positive for congestion, ear pain and sore throat. Negative for facial swelling and sneezing.   Respiratory: Negative for cough and shortness of breath.   Cardiovascular: Negative for chest pain.  Gastrointestinal: Negative for abdominal pain, nausea and vomiting.  All other systems reviewed and are negative.    Physical Exam Updated Vital Signs BP 114/76   Pulse 96   Temp (!) 102.7 F (39.3 C) (Oral) Comment: PA made aware  Resp (!) 22   SpO2 99%   Physical Exam  Constitutional: She appears well-developed and well-nourished. No distress.  Somewhat anxious in appearance, otherwise resting comfortably  HENT:  Head: Normocephalic and atraumatic.  Right Ear: There is tenderness.  Left Ear: There is tenderness.  Mouth/Throat: Uvula is midline, oropharynx is clear and moist and mucous membranes are normal. No uvula swelling. No oropharyngeal exudate, posterior oropharyngeal edema or posterior oropharyngeal erythema.  No frontal or maxillary sinus TTP.  Nasal septum is midline with with ulceration secondary to chronic cocaine use.  Bilateral mucosal edema noted.  TMs with erythema bilaterally no bulging.  No mastoid tenderness or tenderness to  palpation of the pinna or tragus.  Posterior oropharynx with mild erythema but no tonsillar hypertrophy, uvular deviation, exudates, trismus, or sublingual abnormalities.  Eyes: Conjunctivae and EOM are normal. Pupils are equal, round, and reactive to light. Right eye exhibits no discharge. Left eye exhibits no discharge.  Neck: Normal range of motion. Neck supple. No JVD present. No tracheal deviation present.  Bilateral anterior cervical LAD.  No meningeal signs  Cardiovascular: Normal rate and regular rhythm.  Pulmonary/Chest: Effort normal and breath sounds normal. No stridor. No respiratory distress. She has no wheezes. She has no rhonchi. She has no rales. She exhibits no tenderness.  Abdominal: Soft. Bowel sounds are normal. She exhibits no distension. There is no tenderness.  Musculoskeletal: She exhibits no edema.  Lymphadenopathy:    She has cervical adenopathy.  Neurological: She is alert.  Skin: Skin is warm and dry. There is erythema.  Swelling and erythema to the dorsum of the left hand extending to the forearm as well as the right foot.  Slightly decreased range of motion secondary to pain.  Good grip strength and 5/5 strength of digits with flexion and extension against resistance, and good EHL strength and plantar flexion strength.  No crepitus noted.  Track marks noted diffusely to the extremities.  Two areas of erythema with central pustular lesion to the medial aspect of the left distal lower extremity superior to the medial malleolus  Psychiatric: She has a normal mood and affect. Her behavior is normal.  Nursing note and vitals reviewed.    ED Treatments / Results  Labs (all labs ordered are listed, but only abnormal results are displayed) Labs Reviewed  CBC WITH DIFFERENTIAL/PLATELET - Abnormal; Notable for the following components:      Result Value   RBC 3.86 (*)    Hemoglobin 10.9 (*)    HCT 32.4 (*)    Neutro Abs 9.2 (*)    All other components within normal  limits  BASIC METABOLIC PANEL - Abnormal; Notable for the following components:   Potassium 3.0 (*)    Glucose, Bld 136 (*)    Calcium 8.8 (*)    All other components within normal limits  CULTURE, BLOOD (ROUTINE X 2)  CULTURE, BLOOD (ROUTINE X 2)  I-STAT CG4 LACTIC ACID, ED  I-STAT CG4 LACTIC ACID, ED    EKG  EKG Interpretation None       Radiology Dg Hand Complete Left  Result Date: 10/23/2017 CLINICAL DATA:  IV drug use.  Cellulitis EXAM: LEFT HAND - COMPLETE 3+ VIEW COMPARISON:  None. FINDINGS: Soft tissue swelling along the dorsum of the hand. No underlying bony abnormality. No radiographic changes of osteomyelitis. No radiopaque foreign bodies. IMPRESSION: No acute bony abnormality. Electronically Signed   By: Charlett NoseKevin  Dover M.D.   On: 10/23/2017 19:46   Dg Foot Complete Right  Result Date: 10/23/2017 CLINICAL DATA:  Cellulitis, IV drug use in the foot last night. Swelling. EXAM: RIGHT FOOT COMPLETE - 3+ VIEW COMPARISON:  None. FINDINGS: There is no evidence of fracture or  dislocation. There is no evidence of arthropathy or other focal bone abnormality. No bony destructive change. Dorsal soft tissue edema. No soft tissue air. No radiopaque foreign body. IMPRESSION: Soft tissue edema without osseous abnormality or radiopaque foreign body. Electronically Signed   By: Rubye OaksMelanie  Ehinger M.D.   On: 10/23/2017 19:46    Procedures Procedures (including critical care time)  Medications Ordered in ED Medications  0.9 %  sodium chloride infusion (not administered)  piperacillin-tazobactam (ZOSYN) IVPB 3.375 g (3.375 g Intravenous New Bag/Given 10/23/17 1949)  vancomycin (VANCOCIN) IVPB 1000 mg/200 mL premix (not administered)  dexamethasone (DECADRON) injection 10 mg (10 mg Intravenous Given 10/23/17 1751)  amoxicillin-clavulanate (AUGMENTIN) 875-125 MG per tablet 1 tablet (1 tablet Oral Given 10/23/17 1751)  sodium chloride 0.9 % bolus 1,000 mL (1,000 mLs Intravenous New Bag/Given  10/23/17 1913)  acetaminophen (TYLENOL) tablet 1,000 mg (1,000 mg Oral Given 10/23/17 1949)     Initial Impression / Assessment and Plan / ED Course  I have reviewed the triage vital signs and the nursing notes.  Pertinent labs & imaging results that were available during my care of the patient were reviewed by me and considered in my medical decision making (see chart for details).     Patient presents with otalgia and exam consistent with acute otitis media. No concern for acute mastoiditis, meningitis.  Examination is not concerning for strep pharyngitis, PTA, or soft tissue skin infection.  Low suspicion of pneumonia in the absence of shortness of breath, fever, cough, or adventitious lung sounds. Patient expressed interest in heroin cessation.    5:45PM During RN evaluation prior to discharge, patient noted that she has had erythema and swelling to the dorsum of her left hand, left ankle, and dorsum of left foot where she injected heroin yesterday evening.  She notes the erythema and tenderness and swelling has been worsening.  Repeat vitals show low-grade fever of 100 F and tachycardia.  No discernible abscess noted on exam of left hand or right foot, however concern for progression of the cellulitis to a deep space hand infection.  Will obtain labs, blood cultures, radiographs, and initiate empiric antibiotic therapy and fluid resuscitation.Marland Kitchen.  8:14 PM  Imaging shows no acute bony abnormality but does show diffuse soft tissue swelling.  No leukocytosis and lactate is negative.  Spoke with Dr. Robb Matarrtiz with [facility] hospitalist service who agrees to assume care of patient and bring her into the hospital for further workup and management.  Patient seen and evaluated by Dr. Donnald GarrePfeiffer who agrees with assessment and plan at this time.    Final Clinical Impressions(s) / ED Diagnoses   Final diagnoses:  Cellulitis of multiple sites  Other acute nonsuppurative otitis media of both ears, recurrence  not specified    ED Discharge Orders        Ordered    amoxicillin-clavulanate (AUGMENTIN) 875-125 MG tablet  Every 12 hours,   Status:  Discontinued     10/23/17 1724    fluticasone (FLONASE) 50 MCG/ACT nasal spray  Daily,   Status:  Discontinued     10/23/17 1724       Jeanie SewerFawze, Kecia Swoboda A, PA-C 10/23/17 1731    Arby BarrettePfeiffer, Marcy, MD 10/23/17 1814    Jeanie SewerFawze, Osiris Charles A, PA-C 10/23/17 2001    Jeanie SewerFawze, Karsen Fellows A, PA-C 10/23/17 2014    Arby BarrettePfeiffer, Marcy, MD 10/24/17 2153

## 2017-10-23 NOTE — Progress Notes (Signed)
A consult was received from an ED physician for Vancomycin per pharmacy dosing.  The patient's profile has been reviewed for ht/wt/allergies/indication/available labs. A one time order has been placed for the above antibiotics.  Further antibiotics/pharmacy consults should be ordered by admitting physician if indicated.                       Thank you, Bernadene Personrew Shatia Sindoni, PharmD, BCPS Pager: [phone] 10/23/2017, 7:05 PM

## 2017-10-23 NOTE — ED Triage Notes (Signed)
Pt reports sore throat x2 weeks. She came in today because last night she had a fever and chills all night. She has taken motrin for the fever. No fever in triage. Also reports generalized body aches. A&Ox4. VSS.

## 2017-10-23 NOTE — ED Notes (Signed)
Unable to obtain second IV at this time. Pt is a very difficult access.

## 2017-10-23 NOTE — ED Notes (Signed)
Patient transported to X-ray 

## 2017-10-23 NOTE — ED Notes (Signed)
Bed: WA07 Expected date:  Expected time:  Means of arrival:  Comments: Triage 7

## 2017-10-23 NOTE — H&P (Signed)
History and Physical    Margaret Datesriscilla K Read ZOX:[id number] DOB: [birth year] DOA: 10/23/2017  PCP: System, Provider Not In   Patient coming from: Home.  I have personally briefly reviewed [facility]  Chief Complaint: Sore throat for 2 weeks.  HPI: Margaret [name] is a 41 y.o. female with medical history significant of polysubstance abuse, opioid dependence, heart murmur, hypertension, depression who is coming to the emergency department with complaints of progressively worse sore throat for the past 2 weeks associated with fever and chills as she noticed last night.  However, the patient also noticed that her left hand and left foot at the ankle area started having redness last night as well.  She complains of headache, decreased appetite, fatigue, malaise and body aches.  Denies cough, wheezing, chest pain, dyspnea, palpitations, dizziness, orthopnea or pitting edema of the lower extremities.  Denies abdominal pain, nausea, emesis, diarrhea or constipation.  Denies dysuria, frequency or hematuria.  ED Course: Initial vital signs in the emergency department temperature 98.52F, pulse 90, respirations 16, blood pressure 123/69 mmHg and O2 sat 100%.  The patient subsequently became febrile with a temperature of 102.7 F and tachycardic.  She was given a 1000 mL normal saline bolus, acetaminophen 1000 mg p.o. x1, vancomycin and Zosyn.  I added a normal saline plus KCl 20 mEq 1000 mL bolus and Toradol 30 mg IVP.  Her lactic acid has been normal x2.  WBC were 10.5 with 87% neutrophils, hemoglobin 10.9 g/dL and platelets [id number].  Her BMP shows a potassium of 3.0 mmol/L, glucose of 136 and calcium 8.8 mg/dL.  All other electrolytes and renal function are normal.  Had an x-ray does not show any acute bony abnormality.  Foot x-ray shows soft tissue edema without any osseous abnormality or foreign body.  Please see images and full radiology report for further  detail.  Review of Systems: As per HPI otherwise 10 point review of systems negative.    Past Medical History:  Diagnosis Date  . Marland Kitchen. Drug use Depression   . Heart murmur   . Marland Kitchen. Hypertension Opioid dependence (HCC)     Past Surgical History:  Procedure Laterality Date  . TUBAL LIGATION       reports that she has quit smoking. she has never used smokeless tobacco. She reports that she uses drugs. Drugs: Cocaine, Heroin, Other-see comments, and Marijuana. She reports that she does not drink alcohol.  No Known Allergies  Family History  Problem Relation Age of Onset  . Bipolar disorder Mother   . COPD Mother   . Heart disease Father     Prior to Admission medications   Medication Sig Start Date End Date Taking? Authorizing Provider  acetaminophen (TYLENOL) 325 MG tablet Take 2 tablets (650 mg total) by mouth every 6 (six) hours as needed for mild pain (or Fever >/= 101). 04/17/17  Yes Maxie BarbBhandari, Dron Prasad, MD  ibuprofen (ADVIL,MOTRIN) 800 MG tablet Take 1 tablet (800 mg total) by mouth 3 (three) times daily as needed for moderate pain. 04/17/17  Yes Maxie BarbBhandari, Dron Prasad, MD    Physical Exam: Vitals:   10/23/17 2046 10/23/17 2100 10/23/17 2130 10/23/17 2153  BP:  110/65 109/69 116/81  Pulse:  79 68 65  Resp:  18 19 18   Temp: 98.4 F (36.9 C)   99.3 F (37.4 C)  TempSrc: Oral   Oral  SpO2:  98% 99% 99%    Constitutional: Febrile and diaphoretic Eyes: PERRL,  lids and conjunctivae normal ENMT: Bilateral ear canal erythema.  Nasal septum ulceration.  Mucous membranes are mildly dry. Posterior pharynx shows mild erythema.  Neck: supple, positive anterior cervical change adenopathy, no thyromegaly Respiratory: clear to auscultation bilaterally, no wheezing, no crackles. Normal respiratory effort. No accessory muscle use.  Cardiovascular: Tachycardic at 103 bpm, no murmurs / rubs / gallops. No extremity edema. 2+ pedal pulses. No carotid bruits.  Abdomen: Soft, no tenderness,  no masses palpated. No hepatosplenomegaly. Bowel sounds positive.  Musculoskeletal: no clubbing / cyanosis.  Left hand and left foot showed mild decreased in ROM, no contractures. Normal muscle tone.  Skin: Positive erythema, calor, edema and TTP on dorsal aspect of left hand.  Positive erythema with surrounding edema, calor and TTP on upper dorsal aspect and anterior ankle area associated with small a few stools. Neurologic: CN 2-12 grossly intact. Sensation intact, DTR normal. Strength 5/5 in all 4.  Psychiatric:  Alert and oriented x 4.    Labs on Admission: I have personally reviewed following labs and imaging studies  CBC: Recent Labs  Lab 10/23/17 1805  WBC 10.5  NEUTROABS 9.2*  HGB 10.9*  HCT 32.4*  MCV 83.9  PLT 175   Basic Metabolic Panel: Recent Labs  Lab 10/23/17 1805  NA 137  K 3.0*  CL 105  CO2 25  GLUCOSE 136*  BUN 10  CREATININE 0.60  CALCIUM 8.8*   GFR: CrCl cannot be calculated (Unknown ideal weight.). Liver Function Tests: No results for input(s): AST, ALT, ALKPHOS, BILITOT, PROT, ALBUMIN in the last 168 hours. No results for input(s): LIPASE, AMYLASE in the last 168 hours. No results for input(s): AMMONIA in the last 168 hours. Coagulation Profile: No results for input(s): INR, PROTIME in the last 168 hours. Cardiac Enzymes: No results for input(s): CKTOTAL, CKMB, CKMBINDEX, TROPONINI in the last 168 hours. BNP (last 3 results) No results for input(s): PROBNP in the last 8760 hours. HbA1C: No results for input(s): HGBA1C in the last 72 hours. CBG: No results for input(s): GLUCAP in the last 168 hours. Lipid Profile: No results for input(s): CHOL, HDL, LDLCALC, TRIG, CHOLHDL, LDLDIRECT in the last 72 hours. Thyroid Function Tests: No results for input(s): TSH, T4TOTAL, FREET4, T3FREE, THYROIDAB in the last 72 hours. Anemia Panel: No results for input(s): VITAMINB12, FOLATE, FERRITIN, TIBC, IRON, RETICCTPCT in the last 72 hours. Urine  analysis: No results found for: COLORURINE, APPEARANCEUR, LABSPEC, PHURINE, GLUCOSEU, HGBUR, BILIRUBINUR, KETONESUR, PROTEINUR, UROBILINOGEN, NITRITE, LEUKOCYTESUR  Radiological Exams on Admission: Dg Hand Complete Left  Result Date: 10/23/2017 CLINICAL DATA:  IV drug use.  Cellulitis EXAM: LEFT HAND - COMPLETE 3+ VIEW COMPARISON:  None. FINDINGS: Soft tissue swelling along the dorsum of the hand. No underlying bony abnormality. No radiographic changes of osteomyelitis. No radiopaque foreign bodies. IMPRESSION: No acute bony abnormality. Electronically Signed   By: Charlett NoseKevin  Dover M.D.   On: 10/23/2017 19:46   Dg Foot Complete Right  Result Date: 10/23/2017 CLINICAL DATA:  Cellulitis, IV drug use in the foot last night. Swelling. EXAM: RIGHT FOOT COMPLETE - 3+ VIEW COMPARISON:  None. FINDINGS: There is no evidence of fracture or dislocation. There is no evidence of arthropathy or other focal bone abnormality. No bony destructive change. Dorsal soft tissue edema. No soft tissue air. No radiopaque foreign body. IMPRESSION: Soft tissue edema without osseous abnormality or radiopaque foreign body. Electronically Signed   By: Rubye OaksMelanie  Ehinger M.D.   On: 10/23/2017 19:46    EKG: Independently reviewed. Vent. rate  96 BPM PR interval * ms QRS duration 74 ms QT/QTc 379/479 ms P-R-T axes 69 47 14 Sinus rhythm RSR' in V1 or V2, probably normal variant Borderline T wave abnormalities Baseline wander in lead(s) II III aVF V1 V6 No previous tracing to compare with.  Assessment/Plan Principal Problem:   Cellulitis of left hand Admit to telemetry/inpatient. Continue IV fluids. Continue acetaminophen as needed. Continue ketorolac as needed. Vancomycin per pharmacy. Zosyn per pharmacy. Follow-up blood cultures and sensitivity.  Active Problems:   Cellulitis of left lower extremity Continue IV fluids. Continue IV antibiotics as above.    Heart murmur The patient has a previous history of heart  murmur. However, will check echo in the setting of IVDA with fever due to cellulitis.    Hypokalemia Replacing. Follow-up potassium level. Check magnesium level.    Opioid dependence (HCC)   Polysubstance abuse (HCC) No signs of withdrawals at this time. Hepatitis and HIV were negative in June this year. Will recheck hepatitis panel and HIV in a.m. MRSA screening by PCR. Consider clonidine if the patient shows signs of withdrawals.    DVT prophylaxis: Lovenox SQ. Code Status: Full code. Family Communication:  Disposition Plan: Admit for IV antibiotic therapy for 2-3 days. Consults called:  Admission status: Inpatient/telemetry.   Bobette Mo MD [facility] Pager [phone].  If 7PM-7AM, please contact night-coverage [email or web address] Password [email or web address]  10/23/2017, 10:45 PM

## 2017-10-23 NOTE — Discharge Instructions (Addendum)
Please take all of your antibiotics until finished!   You may develop abdominal discomfort or diarrhea from the antibiotic.  You may help offset this with probiotics which you can buy or get in yogurt. Do not eat  or take the probiotics until 2 hours after your antibiotic.    Use Flonase for nasal congestion.  Use warm water salt gargles, over-the-counter throat lozenges, or sprays for your sore throat.   Follow-up with [place] [facility] for reevaluation of your symptoms.  Call them on Monday and get yourself on were seen in the emergency department and were referred to them. I have also attached information for outpatient rehabilitation centers.  The peer support specialists should be contacting you within the next few days to set up a time to meet.  Return to the ED if any concerning signs or symptoms develop.

## 2017-10-23 NOTE — Progress Notes (Signed)
Pharmacy Antibiotic Note  Margaret [name] is a 41 y.o. female with sore throat and fever admitted on 10/23/2017 with cellulitis of the left hand.  Pharmacy has been consulted for vancomycin and zosyn dosing.  Plan: Zosyn 3.375g IV q8h (4 hour infusion).  Vancomycin 1 Gm x1 then 1250 mg IV q24h for est AUC=473 Goal AUC 400-500 Daily scr F/u cultures/levels  Height: 5' (152.4 cm) Weight: 135 lb (61.2 kg) IBW/kg (Calculated) : 45.5  Temp (24hrs), Avg:99.8 F (37.7 C), Min:98.4 F (36.9 C), Max:102.7 F (39.3 C)  Recent Labs  Lab 10/23/17 1805 10/23/17 1906 10/23/17 2019  WBC 10.5  --   --   CREATININE 0.60  --   --   LATICACIDVEN  --  1.06 0.52    Estimated Creatinine Clearance: 77.2 mL/min (by C-G formula based on SCr of 0.6 mg/dL).    No Known Allergies  Antimicrobials this admission: 12/15 zosyn >>  12/15 vancomycin >>   Dose adjustments this admission:   Microbiology results:  BCx:   UCx:    Sputum:    MRSA PCR:   Thank you for allowing pharmacy to be a part of this patient's care.  Lorenza EvangelistGreen, Marcia Hartwell R 10/23/2017 11:21 PM

## 2017-10-23 NOTE — ED Notes (Signed)
ED TO INPATIENT HANDOFF REPORT  Name/Age/Gender Margaret [name] 41 y.o. female  Code Status Code Status History    Date Active Date Inactive Code Status Order ID Comments User Context   04/15/2017 18:12 04/17/2017 19:57 Full Code [id number]  Murlean Iba, MD ED   10/03/2012 18:19 10/04/2012 07:31 Full Code [id number]  Dewaine Oats, PA-C ED   12/28/2011 12:40 12/30/2011 11:26 Full Code [id number]  Blair Heys, MD ED      Home/SNF/Other Home  Chief Complaint Flu Like Symptoms and Possible Pneumonia   Level of Care/Admitting Diagnosis ED Disposition    ED Disposition Condition [place]: [facility] [[id number]]  Level of Care: Med-Surg [16]  Diagnosis: Cellulitis of left hand [119417]  Admitting Physician: Reubin Milan [[id number]]  Attending Physician: Reubin Milan [[id number]]  Estimated length of stay: past midnight tomorrow  Certification:: I certify this patient will need inpatient services for at least 2 midnights  PT Class (Do Not Modify): Inpatient [101]  PT Acc Code (Do Not Modify): Private [1]       Medical History Past Medical History:  Diagnosis Date  . Drug use   . Heart murmur   . Hypertension     Allergies No Known Allergies  IV Location/Drains/Wounds Patient Lines/Drains/Airways Status   Active Line/Drains/Airways    Name:   Placement date:   Placement time:   Site:   Days:   Peripheral IV 10/23/17 Right;Upper Forearm   10/23/17    1902    Forearm   less than 1   Wound / Incision (Open or Dehisced) 04/15/17 Other (Comment) Leg Right;Lower   04/15/17    1823    Leg   191          Labs/Imaging Results for orders placed or performed during the hospital encounter of 10/23/17 (from the past 48 hour(s))  CBC with Differential     Status: Abnormal   Collection Time: 10/23/17  6:05 PM  Result Value Ref Range   WBC 10.5 4.0 - 10.5 K/uL   RBC 3.86 (L) 3.87 - 5.11 MIL/uL   Hemoglobin 10.9 (L)  12.0 - 15.0 g/dL   HCT 32.4 (L) 36.0 - 46.0 %   MCV 83.9 78.0 - 100.0 fL   MCH 28.2 26.0 - 34.0 pg   MCHC 33.6 30.0 - 36.0 g/dL   RDW 14.8 11.5 - 15.5 %   Platelets 175 150 - 400 K/uL   Neutrophils Relative % 87 %   Neutro Abs 9.2 (H) 1.7 - 7.7 K/uL   Lymphocytes Relative 8 %   Lymphs Abs 0.8 0.7 - 4.0 K/uL   Monocytes Relative 5 %   Monocytes Absolute 0.5 0.1 - 1.0 K/uL   Eosinophils Relative 0 %   Eosinophils Absolute 0.0 0.0 - 0.7 K/uL   Basophils Relative 0 %   Basophils Absolute 0.0 0.0 - 0.1 K/uL  Basic metabolic panel     Status: Abnormal   Collection Time: 10/23/17  6:05 PM  Result Value Ref Range   Sodium 137 135 - 145 mmol/L   Potassium 3.0 (L) 3.5 - 5.1 mmol/L   Chloride 105 101 - 111 mmol/L   CO2 25 22 - 32 mmol/L   Glucose, Bld 136 (H) 65 - 99 mg/dL   BUN 10 6 - 20 mg/dL   Creatinine, Ser 0.60 0.44 - 1.00 mg/dL   Calcium 8.8 (L) 8.9 - 10.3 mg/dL   GFR calc non Af  Amer >60 >60 mL/min   GFR calc Af Amer >60 >60 mL/min    Comment: (NOTE) The eGFR has been calculated using the CKD EPI equation. This calculation has not been validated in all clinical situations. eGFR's persistently <60 mL/min signify possible Chronic Kidney Disease.    Anion gap 7 5 - 15  I-Stat CG4 Lactic Acid, ED     Status: None   Collection Time: 10/23/17  7:06 PM  Result Value Ref Range   Lactic Acid, Venous 1.06 0.5 - 1.9 mmol/L  I-Stat CG4 Lactic Acid, ED     Status: None   Collection Time: 10/23/17  8:19 PM  Result Value Ref Range   Lactic Acid, Venous 0.52 0.5 - 1.9 mmol/L   Dg Hand Complete Left  Result Date: 10/23/2017 CLINICAL DATA:  IV drug use.  Cellulitis EXAM: LEFT HAND - COMPLETE 3+ VIEW COMPARISON:  None. FINDINGS: Soft tissue swelling along the dorsum of the hand. No underlying bony abnormality. No radiographic changes of osteomyelitis. No radiopaque foreign bodies. IMPRESSION: No acute bony abnormality. Electronically Signed   By: Rolm Baptise M.D.   On: 10/23/2017 19:46    Dg Foot Complete Right  Result Date: 10/23/2017 CLINICAL DATA:  Cellulitis, IV drug use in the foot last night. Swelling. EXAM: RIGHT FOOT COMPLETE - 3+ VIEW COMPARISON:  None. FINDINGS: There is no evidence of fracture or dislocation. There is no evidence of arthropathy or other focal bone abnormality. No bony destructive change. Dorsal soft tissue edema. No soft tissue air. No radiopaque foreign body. IMPRESSION: Soft tissue edema without osseous abnormality or radiopaque foreign body. Electronically Signed   By: Jeb Levering M.D.   On: 10/23/2017 19:46    Pending Labs Unresulted Labs (From admission, onward)   Start     Ordered   10/23/17 2058  Magnesium  Add-on,   R     10/23/17 2057   10/23/17 1805  Blood culture (routine x 2)  BLOOD CULTURE X 2,   STAT     10/23/17 1805   Signed and Held  Creatinine, serum  (enoxaparin (LOVENOX)    CrCl >/= 30 ml/min)  Weekly,   R    Comments:  while on enoxaparin therapy    Signed and Held   Signed and Held  CBC WITH DIFFERENTIAL  Daily,   R     Signed and Held   Signed and Held  Comprehensive metabolic panel  Tomorrow morning,   R     Signed and Held   Signed and Held  Basic metabolic panel  Daily,   R     Signed and Held      Vitals/Pain Today's Vitals   10/23/17 1910 10/23/17 2030 10/23/17 2046 10/23/17 2047  BP: 114/76 117/71    Pulse: 96 86    Resp: (!) 22 20    Temp:   98.4 F (36.9 C)   TempSrc:   Oral   SpO2: 99% 94%    PainSc:    0-No pain    Isolation Precautions No active isolations  Medications Medications  0.9 %  sodium chloride infusion (not administered)  dexamethasone (DECADRON) injection 10 mg (10 mg Intravenous Given 10/23/17 1751)  amoxicillin-clavulanate (AUGMENTIN) 875-125 MG per tablet 1 tablet (1 tablet Oral Given 10/23/17 1751)  sodium chloride 0.9 % bolus 1,000 mL (1,000 mLs Intravenous New Bag/Given 10/23/17 1913)  acetaminophen (TYLENOL) tablet 1,000 mg (1,000 mg Oral Given 10/23/17 1949)   piperacillin-tazobactam (ZOSYN) IVPB 3.375 g (0 g  Intravenous Stopped 10/23/17 2016)  vancomycin (VANCOCIN) IVPB 1000 mg/200 mL premix (1,000 mg Intravenous New Bag/Given 10/23/17 2024)    Mobility walks

## 2017-10-24 ENCOUNTER — Inpatient Hospital Stay (HOSPITAL_COMMUNITY): Payer: Self-pay

## 2017-10-24 DIAGNOSIS — E876 Hypokalemia: Secondary | ICD-10-CM

## 2017-10-24 DIAGNOSIS — I361 Nonrheumatic tricuspid (valve) insufficiency: Secondary | ICD-10-CM

## 2017-10-24 DIAGNOSIS — L0390 Cellulitis, unspecified: Secondary | ICD-10-CM

## 2017-10-24 DIAGNOSIS — F1910 Other psychoactive substance abuse, uncomplicated: Secondary | ICD-10-CM

## 2017-10-24 LAB — COMPREHENSIVE METABOLIC PANEL
ALT: 57 U/L — ABNORMAL HIGH (ref 14–54)
ANION GAP: 5 (ref 5–15)
AST: 83 U/L — ABNORMAL HIGH (ref 15–41)
Albumin: 3 g/dL — ABNORMAL LOW (ref 3.5–5.0)
Alkaline Phosphatase: 68 U/L (ref 38–126)
BILIRUBIN TOTAL: 0.9 mg/dL (ref 0.3–1.2)
BUN: 13 mg/dL (ref 6–20)
CO2: 24 mmol/L (ref 22–32)
Calcium: 8.5 mg/dL — ABNORMAL LOW (ref 8.9–10.3)
Chloride: 112 mmol/L — ABNORMAL HIGH (ref 101–111)
Creatinine, Ser: 0.56 mg/dL (ref 0.44–1.00)
GFR calc Af Amer: >60 mL/min (ref >60)
Glucose, Bld: 217 mg/dL — ABNORMAL HIGH (ref 65–99)
POTASSIUM: 3.3 mmol/L — AB (ref 3.5–5.1)
Sodium: 141 mmol/L (ref 135–145)
TOTAL PROTEIN: 6.1 g/dL — AB (ref 6.5–8.1)

## 2017-10-24 LAB — CBC WITH DIFFERENTIAL/PLATELET
Basophils Absolute: 0 10*3/uL (ref 0.0–0.1)
Basophils Relative: 0 %
Eosinophils Absolute: 0 10*3/uL (ref 0.0–0.7)
Eosinophils Relative: 0 %
HEMATOCRIT: 29.4 % — AB (ref 36.0–46.0)
Hemoglobin: 9.5 g/dL — ABNORMAL LOW (ref 12.0–15.0)
LYMPHS PCT: 12 %
Lymphs Abs: 0.6 10*3/uL — ABNORMAL LOW (ref 0.7–4.0)
MCH: 27.6 pg (ref 26.0–34.0)
MCHC: 32.3 g/dL (ref 30.0–36.0)
MCV: 85.5 fL (ref 78.0–100.0)
MONO ABS: 0.2 10*3/uL (ref 0.1–1.0)
MONOS PCT: 4 %
NEUTROS ABS: 3.9 10*3/uL (ref 1.7–7.7)
Neutrophils Relative %: 84 %
Platelets: 102 10*3/uL — ABNORMAL LOW (ref 150–400)
RBC: 3.44 MIL/uL — ABNORMAL LOW (ref 3.87–5.11)
RDW: 15.1 % (ref 11.5–15.5)
WBC: 4.7 10*3/uL (ref 4.0–10.5)

## 2017-10-24 LAB — ECHOCARDIOGRAM COMPLETE
Height: 60 in
WEIGHTICAEL: 2160 [oz_av]

## 2017-10-24 MED ORDER — LOPERAMIDE HCL 2 MG PO CAPS
2.0000 mg | ORAL_CAPSULE | ORAL | Status: AC | PRN
  Administered 2017-10-25: 2 mg via ORAL
  Administered 2017-10-26 (×2): 4 mg via ORAL
  Administered 2017-10-27: 2 mg via ORAL
  Administered 2017-10-27: 4 mg via ORAL
  Administered 2017-10-28 (×2): 2 mg via ORAL
  Filled 2017-10-24: qty 2
  Filled 2017-10-24: qty 1
  Filled 2017-10-24: qty 2
  Filled 2017-10-24 (×3): qty 1
  Filled 2017-10-24: qty 2
  Filled 2017-10-24: qty 1

## 2017-10-24 MED ORDER — HYDROXYZINE HCL 25 MG PO TABS
25.0000 mg | ORAL_TABLET | Freq: Four times a day (QID) | ORAL | Status: DC | PRN
  Administered 2017-10-26 (×2): 25 mg via ORAL
  Filled 2017-10-24 (×2): qty 1

## 2017-10-24 MED ORDER — VANCOMYCIN HCL 10 G IV SOLR
1250.0000 mg | INTRAVENOUS | Status: DC
  Administered 2017-10-24 – 2017-10-27 (×4): 1250 mg via INTRAVENOUS
  Filled 2017-10-24 (×5): qty 1250

## 2017-10-24 MED ORDER — CLONIDINE HCL 0.1 MG PO TABS
0.1000 mg | ORAL_TABLET | Freq: Four times a day (QID) | ORAL | Status: AC
  Administered 2017-10-24 – 2017-10-25 (×8): 0.1 mg via ORAL
  Filled 2017-10-24 (×8): qty 1

## 2017-10-24 MED ORDER — CLONIDINE HCL 0.1 MG PO TABS
0.1000 mg | ORAL_TABLET | Freq: Every day | ORAL | Status: AC
  Administered 2017-10-28 – 2017-10-29 (×2): 0.1 mg via ORAL
  Filled 2017-10-24 (×2): qty 1

## 2017-10-24 MED ORDER — POTASSIUM CHLORIDE CRYS ER 20 MEQ PO TBCR
40.0000 meq | EXTENDED_RELEASE_TABLET | Freq: Once | ORAL | Status: AC
  Administered 2017-10-24: 40 meq via ORAL
  Filled 2017-10-24: qty 2

## 2017-10-24 MED ORDER — CLONIDINE HCL 0.1 MG PO TABS
0.1000 mg | ORAL_TABLET | ORAL | Status: AC
  Administered 2017-10-26 – 2017-10-27 (×4): 0.1 mg via ORAL
  Filled 2017-10-24 (×4): qty 1

## 2017-10-24 MED ORDER — PIPERACILLIN-TAZOBACTAM 3.375 G IVPB
3.3750 g | Freq: Three times a day (TID) | INTRAVENOUS | Status: DC
  Administered 2017-10-24 – 2017-10-25 (×4): 3.375 g via INTRAVENOUS
  Filled 2017-10-24 (×6): qty 50

## 2017-10-24 MED ORDER — METHOCARBAMOL 500 MG PO TABS
500.0000 mg | ORAL_TABLET | Freq: Three times a day (TID) | ORAL | Status: AC | PRN
  Administered 2017-10-24 – 2017-10-28 (×6): 500 mg via ORAL
  Filled 2017-10-24 (×6): qty 1

## 2017-10-24 MED ORDER — DICYCLOMINE HCL 20 MG PO TABS
20.0000 mg | ORAL_TABLET | Freq: Four times a day (QID) | ORAL | Status: AC | PRN
  Filled 2017-10-24: qty 1

## 2017-10-24 NOTE — Progress Notes (Signed)
Patient made phone call on her personal cell phone while this RN was in her room administering medication to patient. This RN over heard the person on the other end say "I have something hot for you".  The patient responded "OK I will see you in a little bit".  Dr. Rito EhrlichKrishnan; [place], [facility]; Toniann FailWendy, CN and [place] on coming RN notified. MD ordered telesitter for safety.

## 2017-10-24 NOTE — Progress Notes (Addendum)
[facility] PROGRESS NOTE  Margaret [name] ZOX:[id number] DOB: 03-Aug-1976 DOA: 10/23/2017  PCP: System, Provider Not In  Brief History/Interval Summary: 41 year old Caucasian female with a past medical history of polysubstance abuse including heroin abuse, history of heart murmur, depression who presented with fever and chills.  She was found to have erythema over her left hand as well as right foot.  Apparently she had injected herself recently.  Concern was for cellulitis.  Patient was hospitalized.  Reason for Visit: Left hand and right foot cellulitis  Consultants: None  Procedures:  Transthoracic echocardiogram is pending  Antibiotics: Currently on vancomycin and Zosyn  Subjective/Interval History: Complains of nausea, sweating profusely, anxious.  She also complains of lower back pain but denies any falls or injuries recently.  Continues to have discomfort over her left hand as well as right foot.  ROS: Complains of headaches  Objective:  Vital Signs  Vitals:   10/23/17 2153 10/23/17 2318 10/24/17 0531 10/24/17 0953  BP: 116/81  102/81 124/76  Pulse: 65  (!) 53 (!) 51  Resp: 18  15   Temp: 99.3 F (37.4 C)  97.7 F (36.5 C)   TempSrc: Oral  Oral   SpO2: 99%  97%   Weight:  61.2 kg (135 lb)    Height:  5' (1.524 m)      Intake/Output Summary (Last 24 hours) at 10/24/2017 1146 Last data filed at 10/24/2017 0400 Gross per 24 hour  Intake 2423.75 ml  Output -  Net 2423.75 ml   Filed Weights   10/23/17 2318  Weight: 61.2 kg (135 lb)    General appearance: alert, cooperative, appears stated age and no distress Head: Normocephalic, without obvious abnormality, atraumatic Resp: Diminished air entry at the bases.  No definite crackles or wheezing. Cardio: regular rate and rhythm, S1, S2 normal, no murmur, click, rub or gallop GI: soft, non-tender; bowel sounds normal; no masses,  no organomegaly Extremities: Left hand shows erythema on the dorsal  aspect.  No fluctuation.  No erythema noted over the palmar aspect.  She has good radial pulses.  The dorsal aspect is warm to touch.  There is also erythema over the dorsal aspect of the right foot.  She has good dorsalis pedis pulses.  No fluctuation noted. Pulses: 2+ and symmetric Skin: Erythema noted over left hand and right foot as discussed above.  She also has other small areas of erythema in her left thigh area and other parts of her body as well. Neurologic: Awake alert.  Oriented x3.  No focal neurological deficits are noted.  Lab Results:  Data Reviewed: I have personally reviewed following labs and imaging studies  CBC: Recent Labs  Lab 10/23/17 1805 10/24/17 0632  WBC 10.5 4.7  NEUTROABS 9.2* 3.9  HGB 10.9* 9.5*  HCT 32.4* 29.4*  MCV 83.9 85.5  PLT 175 102*    Basic Metabolic Panel: Recent Labs  Lab 10/23/17 1805 10/23/17 2212 10/24/17 0632  NA 137  --  141  K 3.0*  --  3.3*  CL 105  --  112*  CO2 25  --  24  GLUCOSE 136*  --  217*  BUN 10  --  13  CREATININE 0.60  --  0.56  CALCIUM 8.8*  --  8.5*  MG  --  1.9  --     GFR: Estimated Creatinine Clearance: 77.2 mL/min (by C-G formula based on SCr of 0.56 mg/dL).  Liver Function Tests: Recent Labs  Lab 10/24/17 [id number]  AST 83*  ALT 57*  ALKPHOS 68  BILITOT 0.9  PROT 6.1*  ALBUMIN 3.0*      Radiology Studies: Mr Lumbar Spine Wo Contrast  Result Date: 10/24/2017 CLINICAL DATA:  Back pain for several days. EXAM: MRI LUMBAR SPINE WITHOUT CONTRAST TECHNIQUE: Multiplanar, multisequence MR imaging of the lumbar spine was performed. No intravenous contrast was administered. COMPARISON:  None. FINDINGS: Segmentation: 5 lumbar type vertebral bodies. The last full intervertebral disc space is labeled L5-S1. Alignment:  Normal Vertebrae:  Normal marrow signal.  No bone lesions or fracture. Conus medullaris and cauda equina: Conus extends to the L1 level. Conus and cauda equina appear normal. Paraspinal and  other soft tissues: No significant findings. Disc levels: No disc protrusions, spinal or foraminal stenosis. No significant facet disease or pars defects. IMPRESSION: Unremarkable lumbar spine MRI examination. Electronically Signed   By: Rudie Meyer M.D.   On: 10/24/2017 11:35   Dg Hand Complete Left  Result Date: 10/23/2017 CLINICAL DATA:  IV drug use.  Cellulitis EXAM: LEFT HAND - COMPLETE 3+ VIEW COMPARISON:  None. FINDINGS: Soft tissue swelling along the dorsum of the hand. No underlying bony abnormality. No radiographic changes of osteomyelitis. No radiopaque foreign bodies. IMPRESSION: No acute bony abnormality. Electronically Signed   By: Charlett Nose M.D.   On: 10/23/2017 19:46   Dg Foot Complete Right  Result Date: 10/23/2017 CLINICAL DATA:  Cellulitis, IV drug use in the foot last night. Swelling. EXAM: RIGHT FOOT COMPLETE - 3+ VIEW COMPARISON:  None. FINDINGS: There is no evidence of fracture or dislocation. There is no evidence of arthropathy or other focal bone abnormality. No bony destructive change. Dorsal soft tissue edema. No soft tissue air. No radiopaque foreign body. IMPRESSION: Soft tissue edema without osseous abnormality or radiopaque foreign body. Electronically Signed   By: Rubye Oaks M.D.   On: 10/23/2017 19:46     Medications:  Scheduled: . cloNIDine  0.1 mg Oral QID   Followed by  . [START ON 10/26/2017] cloNIDine  0.1 mg Oral BH-qamhs   Followed by  . [START ON 10/28/2017] cloNIDine  0.1 mg Oral QAC breakfast  . enoxaparin (LOVENOX) injection  40 mg Subcutaneous Q24H   Continuous: . 0.9 % NaCl with KCl 20 mEq / L 125 mL/hr at 10/23/17 2251  . piperacillin-tazobactam (ZOSYN)  IV 3.375 g (10/24/17 0952)  . vancomycin     ZOX:[id number], dicyclomine, hydrOXYzine, ibuprofen, loperamide, methocarbamol, ondansetron **OR** ondansetron (ZOFRAN) IV, traMADol  Assessment/Plan:  Principal Problem:   Cellulitis of left hand Active Problems:   Opioid  dependence (HCC)   Polysubstance abuse (HCC)   Heart murmur   Hypokalemia   Cellulitis of left lower extremity    Cellulitis involving the left hand and right foot Most likely due to IV drug use.  There is concern for bacteremia.  Patient is on broad-spectrum antibiotics with vancomycin and Zosyn which will be continued.  Follow-up on blood cultures.  MRSA PCR.  Mildly abnormal transaminases noted.  Repeat tomorrow morning.  Could consider hepatobiliary ultrasound.  Hepatitis panel was unremarkable in June.  HIV was nonreactive in June.  Low back pain Patient denied any falls or injuries.  Patient presumably could have had an infection in that area.  MRI lumbar spine was done which fortunately does not show any acute findings.  Patient does not have any focal neurological deficits.  IV drug use with heroin abuse/Heroin withdrawal Patient is appears to have symptoms suggestive of heroin withdrawal.  She will  be placed on clonidine detox protocol.  Monitor blood pressures closely.  History of heart murmur Echocardiogram is pending considering her history of IV drug use.  Hypokalemia This will be repleted.  Magnesium was 1.9.  History of polysubstance abuse Previous history of same.  Appears that she has been admitted to behavioral health previously.  Will start off by getting social worker to counsel patient.  DVT Prophylaxis: Lovenox    Code Status: Full code Family Communication: Discussed with the patient Disposition Plan: Management as outlined above.    LOS: 1 day   Osvaldo ShipperGokul Jnaya Butrick  [facility] Pager [phone] 10/24/2017, 11:46 AM  If 7PM-7AM, please contact night-coverage at [email or web address], password [email or web address]

## 2017-10-24 NOTE — Progress Notes (Signed)
  2D Echo was attempted but patient was in MRI, will try again later.    Margaret SavoyCasey [name] Tifanny Dollens 10/24/2017, 11:01 AM

## 2017-10-25 ENCOUNTER — Inpatient Hospital Stay (HOSPITAL_COMMUNITY): Payer: Self-pay

## 2017-10-25 DIAGNOSIS — R0781 Pleurodynia: Secondary | ICD-10-CM

## 2017-10-25 DIAGNOSIS — R609 Edema, unspecified: Secondary | ICD-10-CM

## 2017-10-25 LAB — CBC
HCT: 31.7 % — ABNORMAL LOW (ref 36.0–46.0)
Hemoglobin: 10.2 g/dL — ABNORMAL LOW (ref 12.0–15.0)
MCH: 27.8 pg (ref 26.0–34.0)
MCHC: 32.2 g/dL (ref 30.0–36.0)
MCV: 86.4 fL (ref 78.0–100.0)
PLATELETS: 196 10*3/uL (ref 150–400)
RBC: 3.67 MIL/uL — AB (ref 3.87–5.11)
RDW: 15.7 % — ABNORMAL HIGH (ref 11.5–15.5)
WBC: 8.3 10*3/uL (ref 4.0–10.5)

## 2017-10-25 LAB — COMPREHENSIVE METABOLIC PANEL
ALBUMIN: 2.5 g/dL — AB (ref 3.5–5.0)
ALT: 67 U/L — ABNORMAL HIGH (ref 14–54)
ANION GAP: 5 (ref 5–15)
AST: 83 U/L — ABNORMAL HIGH (ref 15–41)
Alkaline Phosphatase: 71 U/L (ref 38–126)
BILIRUBIN TOTAL: 0.7 mg/dL (ref 0.3–1.2)
BUN: 14 mg/dL (ref 6–20)
CHLORIDE: 115 mmol/L — AB (ref 101–111)
CO2: 21 mmol/L — ABNORMAL LOW (ref 22–32)
Calcium: 8.3 mg/dL — ABNORMAL LOW (ref 8.9–10.3)
Creatinine, Ser: 0.58 mg/dL (ref 0.44–1.00)
GFR calc Af Amer: >60 mL/min (ref >60)
Glucose, Bld: 135 mg/dL — ABNORMAL HIGH (ref 65–99)
POTASSIUM: 4.1 mmol/L (ref 3.5–5.1)
Sodium: 141 mmol/L (ref 135–145)
TOTAL PROTEIN: 5.4 g/dL — AB (ref 6.5–8.1)

## 2017-10-25 LAB — LIPASE, BLOOD: LIPASE: 29 U/L (ref 11–51)

## 2017-10-25 LAB — TROPONIN I

## 2017-10-25 LAB — HIV ANTIBODY (ROUTINE TESTING W REFLEX): HIV Screen 4th Generation wRfx: NONREACTIVE

## 2017-10-25 MED ORDER — GI COCKTAIL ~~LOC~~
30.0000 mL | Freq: Once | ORAL | Status: AC
  Administered 2017-10-25: 30 mL via ORAL
  Filled 2017-10-25: qty 30

## 2017-10-25 MED ORDER — KETOROLAC TROMETHAMINE 15 MG/ML IJ SOLN
15.0000 mg | Freq: Once | INTRAMUSCULAR | Status: AC
  Administered 2017-10-25: 15 mg via INTRAVENOUS
  Filled 2017-10-25: qty 1

## 2017-10-25 MED ORDER — LIP MEDEX EX OINT
TOPICAL_OINTMENT | CUTANEOUS | Status: AC
  Administered 2017-10-25: 14:00:00
  Filled 2017-10-25: qty 7

## 2017-10-25 MED ORDER — CEFTRIAXONE SODIUM 2 G IJ SOLR
2.0000 g | INTRAMUSCULAR | Status: DC
  Administered 2017-10-25 – 2017-10-28 (×4): 2 g via INTRAVENOUS
  Filled 2017-10-25 (×5): qty 2

## 2017-10-25 MED ORDER — IOPAMIDOL (ISOVUE-370) INJECTION 76%
INTRAVENOUS | Status: AC
  Filled 2017-10-25: qty 100

## 2017-10-25 MED ORDER — IOPAMIDOL (ISOVUE-370) INJECTION 76%
100.0000 mL | Freq: Once | INTRAVENOUS | Status: AC | PRN
  Administered 2017-10-25: 100 mL via INTRAVENOUS

## 2017-10-25 NOTE — Progress Notes (Signed)
Pharmacy Antibiotic Note  Margaret [name] Datesriscilla K Ho is a 41 y.o. female with sore throat and fever admitted on 10/23/2017 with cellulitis of the left hand.  Pharmacy has been consulted for vancomycin and zosyn dosing.  Today, 10/25/2017 Day #2 full abx - vancomycin/zosyn Narrow zosyn to ceftriaxone Afebrile WBC wnl SCr stable, CrCl 77 ml/min Cultures pending  Plan: Ceftriaxone 2g IV q24h (no further dosage adjustment needed) Continue vancomycin 1250 mg IV q24h for est AUC=473 Goal AUC 400-500 Daily scr F/u cultures/levels  Height: 5' (152.4 cm) Weight: 135 lb (61.2 kg) IBW/kg (Calculated) : 45.5  Temp (24hrs), Avg:97.9 F (36.6 C), Min:97.5 F (36.4 C), Max:98.1 F (36.7 C)  Recent Labs  Lab 10/23/17 1805 10/23/17 1906 10/23/17 2019 10/24/17 0632 10/25/17 0918  WBC 10.5  --   --  4.7  --   CREATININE 0.60  --   --  0.56 0.58  LATICACIDVEN  --  1.06 0.52  --   --     Estimated Creatinine Clearance: 77.2 mL/min (by C-G formula based on SCr of 0.58 mg/dL).    No Known Allergies  Antimicrobials this admission: 12/15 zosyn >> 12/17 12/15 vancomycin >>  12/17 ceftriaxone >>  Dose adjustments this admission:  Microbiology results: 12/15 BCx x2:  12/15 MRSA PCR:   Thank you for allowing pharmacy to be a part of this patient's care.  Loralee PacasErin Tawnie Ehresman, PharmD, BCPS Pager: [phone] 10/25/2017 11:48 AM

## 2017-10-25 NOTE — Progress Notes (Signed)
[facility] PROGRESS NOTE  Margaret [name] [id number] DOB: 1976-06-27 DOA: 10/23/2017  PCP: System, Provider Not In  Brief History/Interval Summary: 41 year old Caucasian female with a past medical history of polysubstance abuse including heroin abuse, history of heart murmur, depression who presented with fever and chills.  She was found to have erythema over her left hand as well as right foot.  Apparently she had injected herself recently.  Concern was for cellulitis.  Patient was hospitalized.  Reason for Visit: Left hand and right foot cellulitis  Consultants: None  Procedures:  Transthoracic echocardiogram Study Conclusions - Left ventricle: The cavity size was normal. Wall thickness was   normal. Systolic function was normal. The estimated ejection   fraction was in the range of 55% to 60%. Wall motion was normal;   there were no regional wall motion abnormalities. Left   ventricular diastolic function parameters were normal. - Mitral valve: Mildly thickened leaflets . There was mild   regurgitation. - Left atrium: The atrium was at the upper limits of normal in   size. 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   Central venous pressure (est): 15 mm Hg. - Tricuspid valve: There was mild-moderate regurgitation. - Pulmonary arteries: PA peak pressure: 37 mm Hg (S). - Pericardium, extracardiac: There was no pericardial effusion.  Impressions: - Normal LV wall thickness with LVEF 55-60% and normal diastolic   function. Mildly thickened mitral leaflets with mild mitral   regurgitation. Upper normal left atrial chamber size. Mild to   moderate tricuspid regurgitation with PASP estimated 37 mmHg.   Evidence of increased CVP noted.  Antibiotics: Currently on vancomycin and Zosyn  Subjective/Interval History: Patient complaining of central chest pain this morning.  Started sometime during the night.  Denies any shortness of breath.  No  radiation of the pain.  Also has pain in the upper abdomen.  Some nausea.  Withdrawal symptoms have improved.  Back pain is better.  She feels that the redness over her left hand and right foot have improved.    ROS: No headaches  Objective:  Vital Signs  Vitals:   10/24/17 1341 10/24/17 2011 10/25/17 0538 10/25/17 0856  BP: 137/83 (!) 142/86 (!) 152/80 133/85  Pulse: (!) 55 60    Resp: 19 20 18    Temp: (!) 97.5 F (36.4 C) 98.1 F (36.7 C) 98.1 F (36.7 C)   TempSrc: Oral Oral Oral   SpO2:  99% 99%   Weight:      Height:        Intake/Output Summary (Last 24 hours) at 10/25/2017 1114 Last data filed at 10/25/2017 0841 Gross per 24 hour  Intake 3860 ml  Output -  Net 3860 ml   Filed Weights   10/23/17 2318  Weight: 61.2 kg (135 lb)    General appearance: Awake alert.  In no distress.  Seems anxious. Resp: Diminished air entry at the bases.  No definite wheezing rales or rhonchi. Chest wall: Tender to palpation over her central chest which appears to reproduce pain. Cardio: S1-S2 is normal regular.  No S3-S4.  No rubs murmurs or bruit GI: Soft.  Mildly tender in the epigastric area without any rebound rigidity or guarding.  No masses organomegaly.  Bowel sounds are present. Extremities: Edema over the dorsal aspect of left hand is improving.  Swelling is improving.  No fluctuation.  Good radial pulses.  Erythema over the right foot also improved.  No fluctuation.  Good pulses.  Pulses: 2+ and symmetric Skin: Improving erythema over left hand and right foot.   Neurologic: Awake alert.  Oriented x3.  No focal neurological deficits.  Lab Results:  Data Reviewed: I have personally reviewed following labs and imaging studies  CBC: Recent Labs  Lab 10/23/17 1805 10/24/17 0632  WBC 10.5 4.7  NEUTROABS 9.2* 3.9  HGB 10.9* 9.5*  HCT 32.4* 29.4*  MCV 83.9 85.5  PLT 175 102*    Basic Metabolic Panel: Recent Labs  Lab 10/23/17 1805 10/23/17 2212 10/24/17 0632  10/25/17 0918  NA 137  --  141 141  K 3.0*  --  3.3* 4.1  CL 105  --  112* 115*  CO2 25  --  24 21*  GLUCOSE 136*  --  217* 135*  BUN 10  --  13 14  CREATININE 0.60  --  0.56 0.58  CALCIUM 8.8*  --  8.5* 8.3*  MG  --  1.9  --   --     GFR: Estimated Creatinine Clearance: 77.2 mL/min (by C-G formula based on SCr of 0.58 mg/dL).  Liver Function Tests: Recent Labs  Lab 10/24/17 8119 10/25/17 0918  AST 83* 83*  ALT 57* 67*  ALKPHOS 68 71  BILITOT 0.9 0.7  PROT 6.1* 5.4*  ALBUMIN 3.0* 2.5*      Radiology Studies: Mr Lumbar Spine Wo Contrast  Result Date: 10/24/2017 CLINICAL DATA:  Back pain for several days. EXAM: MRI LUMBAR SPINE WITHOUT CONTRAST TECHNIQUE: Multiplanar, multisequence MR imaging of the lumbar spine was performed. No intravenous contrast was administered. COMPARISON:  None. FINDINGS: Segmentation: 5 lumbar type vertebral bodies. The last full intervertebral disc space is labeled L5-S1. Alignment:  Normal Vertebrae:  Normal marrow signal.  No bone lesions or fracture. Conus medullaris and cauda equina: Conus extends to the L1 level. Conus and cauda equina appear normal. Paraspinal and other soft tissues: No significant findings. Disc levels: No disc protrusions, spinal or foraminal stenosis. No significant facet disease or pars defects. IMPRESSION: Unremarkable lumbar spine MRI examination. Electronically Signed   By: Rudie Meyer M.D.   On: 10/24/2017 11:35   Dg Chest Port 1 View  Result Date: 10/25/2017 CLINICAL DATA:  New onset of mid chest pain this morning. EXAM: PORTABLE CHEST 1 VIEW COMPARISON:  06/09/2004 FINDINGS: There is a focal area of atelectasis is slightly infiltrate at the left lung base. The patient had a similar area of infiltrate on the prior exam and this could represent scarring or recurrent pneumonia. The lungs are otherwise clear. Heart size and vascularity are normal. Bones are normal. IMPRESSION: Focal area of infiltrate/ atelectasis and/or  scarring at the left lung base. Electronically Signed   By: Francene Boyers M.D.   On: 10/25/2017 08:56   Dg Hand Complete Left  Result Date: 10/23/2017 CLINICAL DATA:  IV drug use.  Cellulitis EXAM: LEFT HAND - COMPLETE 3+ VIEW COMPARISON:  None. FINDINGS: Soft tissue swelling along the dorsum of the hand. No underlying bony abnormality. No radiographic changes of osteomyelitis. No radiopaque foreign bodies. IMPRESSION: No acute bony abnormality. Electronically Signed   By: Charlett Nose M.D.   On: 10/23/2017 19:46   Dg Foot Complete Right  Result Date: 10/23/2017 CLINICAL DATA:  Cellulitis, IV drug use in the foot last night. Swelling. EXAM: RIGHT FOOT COMPLETE - 3+ VIEW COMPARISON:  None. FINDINGS: There is no evidence of fracture or dislocation. There is no evidence of arthropathy or other focal bone abnormality. No bony destructive change. Dorsal  soft tissue edema. No soft tissue air. No radiopaque foreign body. IMPRESSION: Soft tissue edema without osseous abnormality or radiopaque foreign body. Electronically Signed   By: Rubye OaksMelanie  Ehinger M.D.   On: 10/23/2017 19:46     Medications:  Scheduled: . cloNIDine  0.1 mg Oral QID   Followed by  . [START ON 10/26/2017] cloNIDine  0.1 mg Oral BH-qamhs   Followed by  . [START ON 10/28/2017] cloNIDine  0.1 mg Oral QAC breakfast  . enoxaparin (LOVENOX) injection  40 mg Subcutaneous Q24H   Continuous: . 0.9 % NaCl with KCl 20 mEq / L 75 mL/hr at 10/25/17 0628  . piperacillin-tazobactam (ZOSYN)  IV Stopped (10/25/17 0700)  . vancomycin Stopped (10/24/17 1401)   ZOX:[id number]:acetaminophen, dicyclomine, hydrOXYzine, loperamide, methocarbamol, ondansetron **OR** ondansetron (ZOFRAN) IV, traMADol  Assessment/Plan:  Principal Problem:   Cellulitis of left hand Active Problems:   Opioid dependence (HCC)   Polysubstance abuse (HCC)   Heart murmur   Hypokalemia   Cellulitis of left lower extremity    Cellulitis involving the left hand and right  foot Most likely due to IV drug use.  Patient was placed on broad-spectrum antibiotics with vancomycin and Zosyn.  Her erythema is improving.  Cultures are negative so far.  Change Zosyn to ceftriaxone. Follow-up on blood cultures.  MRSA PCR.  Mildly abnormal transaminases noted and is stable this morning. Abdomen is nontender.  Hepatitis panel was unremarkable in June.  HIV was nonreactive in June.  Chest pain Patient complains of chest pain this morning.  There is a pleuritic component to this pain.  EKG was done which does not show any ischemic changes.  Nonspecific T wave changes noted.  We will get a d-dimer.  Troponin was normal.  GI cocktail.  Check lipase level.  Chest x-ray showed atelectasis.  Echocardiogram showed normal wall motion with normal systolic function.  No pericardial effusion noted.  Low back pain Patient denied any falls or injuries.  MRI lumbar spine was done which fortunately does not show any acute findings.  Patient does not have any focal neurological deficits.  IV drug use with heroin abuse/Heroin withdrawal Patient was started on clonidine detox protocol.  Withdrawal symptoms have improved.  Continue to monitor blood pressures closely.    History of heart murmur Echocardiogram shows mild MR and mild to moderate TR.  Hypokalemia Potassium is normal this morning.  History of polysubstance abuse Previous history of same.  Appears that she has been admitted to behavioral health previously.  Social work consulted.  DVT Prophylaxis: Lovenox    Code Status: Full code Family Communication: Discussed with the patient Disposition Plan: Management as outlined above.    LOS: 2 days   Osvaldo ShipperGokul Francisco Eyerly  [facility] Pager [phone] 10/25/2017, 11:14 AM  If 7PM-7AM, please contact night-coverage at [email or web address], password [email or web address]

## 2017-10-25 NOTE — Progress Notes (Signed)
Bilateral lower extremity venous duplex has been completed. Negative for DVT.  10/25/17 2:24 PM Olen CordialGreg Walsie Smeltz RVT

## 2017-10-25 NOTE — Progress Notes (Signed)
Date: October 25, 2017 Dezire Turk, BSN, RN3, CCM [phone] Chart and notes review for patient progress and needs. Will follow for case management and discharge needs. Next review date: [id number] 

## 2017-10-26 DIAGNOSIS — Z818 Family history of other mental and behavioral disorders: Secondary | ICD-10-CM

## 2017-10-26 DIAGNOSIS — Z836 Family history of other diseases of the respiratory system: Secondary | ICD-10-CM

## 2017-10-26 DIAGNOSIS — R918 Other nonspecific abnormal finding of lung field: Secondary | ICD-10-CM

## 2017-10-26 DIAGNOSIS — R011 Cardiac murmur, unspecified: Secondary | ICD-10-CM

## 2017-10-26 DIAGNOSIS — D649 Anemia, unspecified: Secondary | ICD-10-CM

## 2017-10-26 DIAGNOSIS — R7401 Elevation of levels of liver transaminase levels: Secondary | ICD-10-CM

## 2017-10-26 DIAGNOSIS — Z87891 Personal history of nicotine dependence: Secondary | ICD-10-CM

## 2017-10-26 DIAGNOSIS — R000 Tachycardia, unspecified: Secondary | ICD-10-CM

## 2017-10-26 DIAGNOSIS — R740 Nonspecific elevation of levels of transaminase and lactic acid dehydrogenase [LDH]: Secondary | ICD-10-CM

## 2017-10-26 DIAGNOSIS — M549 Dorsalgia, unspecified: Secondary | ICD-10-CM

## 2017-10-26 DIAGNOSIS — Z8249 Family history of ischemic heart disease and other diseases of the circulatory system: Secondary | ICD-10-CM

## 2017-10-26 DIAGNOSIS — L03114 Cellulitis of left upper limb: Principal | ICD-10-CM

## 2017-10-26 DIAGNOSIS — L03115 Cellulitis of right lower limb: Secondary | ICD-10-CM

## 2017-10-26 DIAGNOSIS — F1990 Other psychoactive substance use, unspecified, uncomplicated: Secondary | ICD-10-CM

## 2017-10-26 DIAGNOSIS — I071 Rheumatic tricuspid insufficiency: Secondary | ICD-10-CM

## 2017-10-26 DIAGNOSIS — F19939 Other psychoactive substance use, unspecified with withdrawal, unspecified: Secondary | ICD-10-CM

## 2017-10-26 LAB — COMPREHENSIVE METABOLIC PANEL
ALBUMIN: 2.7 g/dL — AB (ref 3.5–5.0)
ALT: 58 U/L — AB (ref 14–54)
AST: 44 U/L — AB (ref 15–41)
Alkaline Phosphatase: 73 U/L (ref 38–126)
Anion gap: 4 — ABNORMAL LOW (ref 5–15)
BUN: 9 mg/dL (ref 6–20)
CHLORIDE: 117 mmol/L — AB (ref 101–111)
CO2: 22 mmol/L (ref 22–32)
CREATININE: 0.66 mg/dL (ref 0.44–1.00)
Calcium: 8.6 mg/dL — ABNORMAL LOW (ref 8.9–10.3)
GFR calc Af Amer: >60 mL/min (ref >60)
GLUCOSE: 94 mg/dL (ref 65–99)
POTASSIUM: 4 mmol/L (ref 3.5–5.1)
Sodium: 143 mmol/L (ref 135–145)
Total Bilirubin: 0.8 mg/dL (ref 0.3–1.2)
Total Protein: 5.7 g/dL — ABNORMAL LOW (ref 6.5–8.1)

## 2017-10-26 LAB — HEPATITIS PANEL, ACUTE
HCV AB: 0.1 {s_co_ratio} (ref 0.0–0.9)
HEP A IGM: NEGATIVE
HEP B S AG: NEGATIVE
Hep B C IgM: NEGATIVE

## 2017-10-26 LAB — CBC
HEMATOCRIT: 33.3 % — AB (ref 36.0–46.0)
Hemoglobin: 10.8 g/dL — ABNORMAL LOW (ref 12.0–15.0)
MCH: 27.9 pg (ref 26.0–34.0)
MCHC: 32.4 g/dL (ref 30.0–36.0)
MCV: 86 fL (ref 78.0–100.0)
PLATELETS: 184 10*3/uL (ref 150–400)
RBC: 3.87 MIL/uL (ref 3.87–5.11)
RDW: 15.5 % (ref 11.5–15.5)
WBC: 8.3 10*3/uL (ref 4.0–10.5)

## 2017-10-26 MED ORDER — ZOLPIDEM TARTRATE 5 MG PO TABS
5.0000 mg | ORAL_TABLET | Freq: Once | ORAL | Status: AC
  Administered 2017-10-26: 5 mg via ORAL
  Filled 2017-10-26: qty 1

## 2017-10-26 MED ORDER — LORAZEPAM 0.5 MG PO TABS
0.5000 mg | ORAL_TABLET | Freq: Three times a day (TID) | ORAL | Status: DC | PRN
  Administered 2017-10-26 – 2017-10-30 (×8): 0.5 mg via ORAL
  Filled 2017-10-26 (×8): qty 1

## 2017-10-26 MED ORDER — ZOLPIDEM TARTRATE 5 MG PO TABS
5.0000 mg | ORAL_TABLET | Freq: Every evening | ORAL | Status: DC | PRN
Start: 2017-10-26 — End: 2017-10-30
  Administered 2017-10-26 – 2017-10-29 (×4): 5 mg via ORAL
  Filled 2017-10-26 (×4): qty 1

## 2017-10-26 NOTE — Progress Notes (Signed)
PROGRESS NOTE  Margaret [name] ZOX:[id number] DOB: September 30, 1976 DOA: 10/23/2017 PCP: System, Provider Not In  Brief Narrative: 41yow PMH IVDU presented with sore throat x2 weeks, left hand, left foot erythema and pain. Febrile and tachycardic in ED, treated with IV abx. Admitted for cellulitis left hand, RLE. Developed heroin withdrawal.   Assessment/Plan Cellulitis left hand, right foot/ankle in context of IVDU. Plain films negative. Hepatitis and HIV screens negative. - afebrile 48 hours. VSS. - BC NG thus far - continue empiric abx  Bilateral lung nodules concerning for septic emboli in context of IVDU - TTE negative. - will ask ID for comment - continue empiric abx  Heart murmur (known history). Echo showed mild MR and mild-to-moderate TR  Polysubstance abuse, IVDU with heroin withdrawal.  - On clonidine protocol.   Elevated transaminases - stable/trending down. Etiology unclear, likely secondary to drug use.  Chronic normocytic anemia, stable  Chest pain - EKG nonacute. Troponin negative. CT chest negative for PE. Lipase WNL. Echo unremarkable. Suspect secondary to withdrawal and septic emboli  Low back pain. MRI lumbar spine negative. - supportive care   DVT prophylaxis: enoxaparin Code Status: full Family Communication: none Disposition Plan: home    Brendia Sacksaniel Rosann Gorum, MD  [facility] Direct contact: [phone] --Via amion app OR  --[email or web address]; password [email or web address]  7PM-7AM contact night coverage as above 10/26/2017, 12:46 PM  LOS: 3 days   Consultants:  ID  Procedures:  Echo Study Conclusions  - Left ventricle: The cavity size was normal. Wall thickness was   normal. Systolic function was normal. The estimated ejection   fraction was in the range of 55% to 60%. Wall motion was normal;   there were no regional wall motion abnormalities. Left   ventricular diastolic function parameters were normal. - Mitral valve: Mildly thickened leaflets  . There was mild   regurgitation. - Left atrium: The atrium was at the upper limits of normal in   size. 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   Central venous pressure (est): 15 mm Hg. - Tricuspid valve: There was mild-moderate regurgitation. - Pulmonary arteries: PA peak pressure: 37 mm Hg (S). - Pericardium, extracardiac: There was no pericardial effusion.  Impressions:  - Normal LV wall thickness with LVEF 55-60% and normal diastolic   function. Mildly thickened mitral leaflets with mild mitral   regurgitation. Upper normal left atrial chamber size. Mild to   moderate tricuspid regurgitation with PASP estimated 37 mmHg.   Evidence of increased CVP noted.  Antimicrobials: 12/15 zosyn >> 12/17 12/15 vancomycin >>  12/17 ceftriaxone >>  Microbiology results: 12/15 BCx x2:  12/15 MRSA PCR:   Interval history/Subjective: Pain in left hand and right foot decreasing. Continued back pain "from withdrawals". Some chest tightness and pleurisy.  Objective: Vitals:  Vitals:   10/26/17 0502 10/26/17 0830  BP: 98/71 (!) 153/86  Pulse: 74 60  Resp: 16 20  Temp: 98.5 F (36.9 C) 98.5 F (36.9 C)  SpO2: 97% 96%    Exam:  Constitutional:  . Appears calm, uncomfortable, non-toxic Eyes:  . pupils and irises appear normal . Normal lids ENMT:  . grossly normal hearing  . Lips appear normal Respiratory:  . CTA bilaterally, no w/r/r.  . Respiratory effort normal. Cardiovascular:  . RRR, no m/r/g . No LE extremity edema   Musculoskeletal:  . Digits/nails BUE: no cyanosis . RUE, LUE o strength and tone normal, no atrophy, no abnormal movements Skin:  . No erythema LUE or  hand. Wrinkling of skin left hand dorusm c/w previous infection/edema. Fingers with grossly normal perfusion. -Right foot erythema over dorsum of foot, minimal edema, no fluctuance or open wound -multiple track marks noted  . palpation of skin: no induration or  nodules Psychiatric:  . Mental status o Mood, affect appropriate   I have personally reviewed the following:   Labs:  CMP notable for AST/ALT elevation  WBC WNL. Hgb stable 10.8.  Imaging studies:  BLE venous doppler negative for DVT  Test discussed with performing physician:  CT chest noted  Scheduled Meds: . cloNIDine  0.1 mg Oral BH-qamhs   Followed by  . [START ON 10/28/2017] cloNIDine  0.1 mg Oral QAC breakfast  . enoxaparin (LOVENOX) injection  40 mg Subcutaneous Q24H   Continuous Infusions: . cefTRIAXone (ROCEPHIN)  IV Stopped (10/25/17 1348)  . vancomycin Stopped (10/25/17 1307)    Principal Problem:   Cellulitis of left hand Active Problems:   Opioid dependence (HCC)   Polysubstance abuse (HCC)   Heart murmur   Cellulitis of right foot without toes   IVDU (intravenous drug user)   Normocytic anemia   Elevated transaminase level   LOS: 3 days

## 2017-10-26 NOTE — Consult Note (Signed)
[facility]       Reason for Consult: cellulitis    Referring Physician: Dr. Irene LimboGoodrich  Principal Problem:   Cellulitis of left hand Active Problems:   Opioid dependence (HCC)   Polysubstance abuse (HCC)   Heart murmur   Cellulitis of right foot without toes   IVDU (intravenous drug user)   Normocytic anemia   Elevated transaminase level   . cloNIDine  0.1 mg Oral BH-qamhs   Followed by  . [START ON 10/28/2017] cloNIDine  0.1 mg Oral QAC breakfast  . enoxaparin (LOVENOX) injection  40 mg Subcutaneous Q24H    Recommendations: Continue vancomycin and ceftriaxone I will repeat blood cultures TEE  Assessment: She is an active IVDU with nodules on CXR and a new murmur with mild to moderate TV regurgitation on TTE concerning for infective endocarditis.  Though no blood culture is yet positive, I regardless recommend evaluation with TEE.    IVDU - she will not be a candidate for home IV therapy if confirmed.  Will consider alternatives.   HIV, hepatitis C negative    Antibiotics: Total antibiotics day 4 Vancomycin day 4 Ceftriaxone day 2  HPI: Margaret [name] is a 41 y.o. female with history of IVDU and recurrent infections who was having fever, chills and sore throat and came in for evaluation.  Noted cellulitis of left hand and right foot.  No associated rash.  Tachycardic on admission and was having withdrawal symptoms.  Also with back pain.  MRI lumbar without infection.    Review of Systems:  Constitutional: negative for malaise and anorexia Gastrointestinal: negative for diarrhea Integument/breast: negative for rash Musculoskeletal: negative for arthralgias All other systems reviewed and are negative    Past Medical History:  Diagnosis Date  . Depression   . Drug use   . Heart murmur   . Hypertension   . Opioid dependence (HCC)     Social History   Tobacco Use  . Smoking status: Former Games developermoker  . Smokeless tobacco:  Never Used  Substance Use Topics  . Alcohol use: No  . Drug use: Yes    Types: Cocaine, Heroin, Other-see comments, Marijuana    Comment: Heroin    Family History  Problem Relation Age of Onset  . Bipolar disorder Mother   . COPD Mother   . Heart disease Father     No Known Allergies  Physical Exam: Constitutional: in no apparent distress and alert  Vitals:   10/26/17 0502 10/26/17 0830  BP: 98/71 (!) 153/86  Pulse: 74 60  Resp: 16 20  Temp: 98.5 F (36.9 C) 98.5 F (36.9 C)  SpO2: 97% 96%   EYES: anicteric ENMT: no thrush Cardiovascular: Cor Tachy Respiratory: CTA B; normal respiratory effort GI: Bowel sounds are normal, liver is not enlarged, spleen is not enlarged Musculoskeletal: no pedal edema noted Skin: negatives: no rash Hematologic: no cervical lad  Lab Results  Component Value Date   WBC 8.3 10/26/2017   HGB 10.8 (L) 10/26/2017   HCT 33.3 (L) 10/26/2017   MCV 86.0 10/26/2017   PLT 184 10/26/2017    Lab Results  Component Value Date   CREATININE 0.66 10/26/2017   BUN 9 10/26/2017   NA 143 10/26/2017   K 4.0 10/26/2017   CL 117 (H) 10/26/2017   CO2 22 10/26/2017    Lab Results  Component Value Date   ALT 58 (H) 10/26/2017   AST 44 (H) 10/26/2017   ALKPHOS 73  10/26/2017     Microbiology: Recent Results (from the past 240 hour(s))  Blood culture (routine x 2)     Status: None (Preliminary result)   Collection Time: 10/23/17  6:05 PM  Result Value Ref Range Status   Specimen Description BLOOD BLOOD RIGHT FOREARM  Final   Special Requests   Final    BOTTLES DRAWN AEROBIC AND ANAEROBIC Blood Culture adequate volume   Culture   Final    NO GROWTH 1 DAY Performed at [facility], [address] [place], [place] [address]    Report Status PENDING  Incomplete  Blood culture (routine x 2)     Status: None (Preliminary result)   Collection Time: 10/23/17 10:12 PM  Result Value Ref Range Status   Specimen Description BLOOD RIGHT HAND   Final   Special Requests IN PEDIATRIC BOTTLE Blood Culture adequate volume  Final   Culture   Final    NO GROWTH 1 DAY Performed at [facility], [address] [place], [place] [address]    Report Status PENDING  Incomplete    Gardiner Barefootobert W Lliam Hoh, MD [facility] [email or web address] [phone] pager  [phone] 10/26/2017, 2:16 PM

## 2017-10-26 NOTE — Progress Notes (Signed)
Patient restless and anxious. PRN Vistaril 25mg  given. Patient states "that does not help with what I have going on and what I am use to taking". Patient still took medication. Patient requesting that she needs " something stronger" and to page MD. MD was paged. Will continue to monitor.

## 2017-10-27 DIAGNOSIS — I2690 Septic pulmonary embolism without acute cor pulmonale: Secondary | ICD-10-CM

## 2017-10-27 DIAGNOSIS — I76 Septic arterial embolism: Secondary | ICD-10-CM

## 2017-10-27 LAB — MRSA PCR SCREENING: MRSA BY PCR: POSITIVE — AB

## 2017-10-27 MED ORDER — MUPIROCIN 2 % EX OINT
1.0000 "application " | TOPICAL_OINTMENT | Freq: Two times a day (BID) | CUTANEOUS | Status: DC
  Administered 2017-10-27 – 2017-10-30 (×6): 1 via NASAL
  Filled 2017-10-27: qty 22

## 2017-10-27 MED ORDER — CHLORHEXIDINE GLUCONATE CLOTH 2 % EX PADS: 6.0000 | MEDICATED_PAD | Freq: Every day | CUTANEOUS | Status: DC

## 2017-10-27 NOTE — Progress Notes (Signed)
Received call from lab need to repeat MRSA swab.

## 2017-10-27 NOTE — Progress Notes (Signed)
CRITICAL VALUE ALERT  Critical Value:  Positive MRSA PCR  Date & Time Notied:  10/27/17 1447  Provider Notified: Dr. Irene LimboGoodrich via text page  Orders Received/Actions taken:  Standing orders initiated

## 2017-10-27 NOTE — Clinical Social Work Note (Signed)
Clinical Social Work Assessment  Patient Details  Name: Margaret [name] Datesriscilla K Shutter MRN: [id number] Date of Birth: [birth year]  Date of referral:  10/27/17               Reason for consult:  Substance Use/ETOH Abuse                Permission sought to share information with:  Facility Industrial/product designerContact Representative Permission granted to share information::     Name::        Agency::     Relationship::     Contact Information:     Housing/Transportation Living arrangements for the past 2 months:  Homeless Source of Information:  Patient Patient Interpreter Needed:  None Criminal Activity/Legal Involvement Pertinent to Current Situation/Hospitalization:  No - Comment as needed Significant Relationships:  None Lives with:  Self Do you feel safe going back to the place where you live?  Yes Need for family participation in patient care:  Yes (Comment)  Care giving concerns:  Patient is currently homeless. She reports that she has been homeless for the past year. She was previously living with her boyfriend and his parents. She reports that her boyfriend passed and she has been homeless since. Patient reports that she has been using heroin for the past 8 years.     Social Worker assessment / plan: LCSW consulted for Substance Abuse.   Patient was admitted for Cellulitis of left hand.  Patient came in to the ED with complaints of a sore throat. Patient has a history of of polysubstance abuse, opioid dependence, heart murmur, hypertension, and depression.  According to patient records, patient was positive for opiates, cocaine and marijuana.   Patient reports that she has been using heroin for the past 8 years. Patient stated that she has tried several treatment programs in the past including [facility], [facility] and out patient with [facility].   Patient stated that she feels that treatment was not beneficial in the past.   Patient reports that she is interested in treatment again.   Patient is currently  homeless and reports that she has been living in abandon cars and with friends. When asked of significant relationships she reports that she does not have any. She stated that she has a father that lives in [place].   Patient reports that she does not work and does not have insurance.   PLAN: TBD, LCSW will continue to follow.  Employment status:  Unemployed Health and safety inspectornsurance information:  Self Pay (Medicaid Pending) PT Recommendations:  Not assessed at this time Information / Referral to community resources:    Residential and Outpatient resources  Patient/Family's Response to care:  Patient appeared to be depressed at time of assessment. Patient was calm and cooperative during LCSW.   Patient/Family's Understanding of and Emotional Response to Diagnosis, Current Treatment, and Prognosis: There is no family that the patient would like contacted. Patient is understanding of her current diagnosis. Patient is awaiting additional info on treatment plan. LCSW will continue to follow for SA needs.   Emotional Assessment Appearance:  Appears stated age Attitude/Demeanor/Rapport:    Affect (typically observed):  Calm Orientation:  Oriented to Self, Oriented to Place, Oriented to  Time, Oriented to Situation Alcohol / Substance use:  Illicit Drugs Psych involvement (Current and /or in the community):  No (Comment)  Discharge Needs  Concerns to be addressed:  Homelessness Readmission within the last 30 days:  No Current discharge risk:  Lack of support system, Homeless, Substance Abuse, Inadequate Financial  Supports Barriers to Discharge:  Continued Medical Work up   BJ'sBernette Kaisyn Millea, LCSW 10/27/2017, 12:01 PM

## 2017-10-27 NOTE — Progress Notes (Signed)
PROGRESS NOTE  Margaret [name] ZOX:[id number] DOB: [birth year] DOA: 10/23/2017 PCP: System, Provider Not In  Brief Narrative: 41yow PMH IVDU presented with sore throat x2 weeks, left hand, left foot erythema and pain. Febrile and tachycardic in ED, treated with IV abx. Admitted for cellulitis left hand, RLE. Developed heroin withdrawal. Hand and foot cellulitis improving. CT chest showed lung nodules concerning for septic emboli. ID recommended TEE and will make recs based on study.   Assessment/Plan Cellulitis left hand, right foot/ankle in context of IVDU. Plain films negative. Hepatitis and HIV screens negative. - improving rapidly, BC NG thus far.   - continue empiric abx per ID  Bilateral lung nodules concerning for septic emboli in context of IVDU - TTE negative. Agree with plans for TEE per ID. I've asked cardiology to arrange TEE to r/o endocarditis. - continue empiric abx  Heart murmur (known history). Echo showed mild MR and mild-to-moderate TR. TEE pending.  Polysubstance abuse, IVDU with heroin withdrawal.  - On clonidine protocol.   Elevated transaminases - stable/trending down. Etiology unclear, likely secondary to drug use.  Chronic normocytic anemia, stable  Chest pain - EKG nonacute. Troponin negative. CT chest negative for PE. Lipase WNL. Echo unremarkable. Suspect secondary to withdrawal and septic emboli  Low back pain. MRI lumbar spine negative. - continue supportive care   DVT prophylaxis: enoxaparin Code Status: full Family Communication: none Disposition Plan: home    Brendia Sacksaniel Rapheal Masso, MD  [facility] Direct contact: [phone] --Via amion app OR  --[email or web address]; password [email or web address]  7PM-7AM contact night coverage as above 10/27/2017, 12:39 PM  LOS: 4 days   Consultants:  ID  Procedures:  Echo Study Conclusions  - Left ventricle: The cavity size was normal. Wall thickness was   normal. Systolic function was normal. The  estimated ejection   fraction was in the range of 55% to 60%. Wall motion was normal;   there were no regional wall motion abnormalities. Left   ventricular diastolic function parameters were normal. - Mitral valve: Mildly thickened leaflets . There was mild   regurgitation. - Left atrium: The atrium was at the upper limits of normal in   size. 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   Central venous pressure (est): 15 mm Hg. - Tricuspid valve: There was mild-moderate regurgitation. - Pulmonary arteries: PA peak pressure: 37 mm Hg (S). - Pericardium, extracardiac: There was no pericardial effusion.  Impressions:  - Normal LV wall thickness with LVEF 55-60% and normal diastolic   function. Mildly thickened mitral leaflets with mild mitral   regurgitation. Upper normal left atrial chamber size. Mild to   moderate tricuspid regurgitation with PASP estimated 37 mmHg.   Evidence of increased CVP noted.  Antimicrobials: 12/15 zosyn >> 12/17 12/15 vancomycin >>  12/17 ceftriaxone >>  Interval history/Subjective: Feels better today. Left hand much better. Right foot still sore, but better. Wants to quit IVDU. Reports her husband died earlier this year of infection secondary to IVDU.  Objective: Vitals:  Vitals:   10/27/17 0631 10/27/17 1232  BP: 131/82 107/71  Pulse:  65  Resp: 18 18  Temp: 98.6 F (37 C) 98.4 F (36.9 C)  SpO2: 98% 99%    Exam:  Constitutional:   . Appears calm and comfortable Eyes:  . pupils and irises appear normal . Normal lids  ENMT:  . grossly normal hearing  . Lips appear normal Respiratory:  . CTA bilaterally, no w/r/r.  . Respiratory effort normal.  Cardiovascular:  . RRR, no m/r/g. Murmur not appreciated today. . No LE extremity edema   Skin:  . Left hand erythema appears resolved. Multiple track marks noted. Dorsum right foot erythema much improved, though still present, warm and tender to touch. Small area of  fluctuance without open wound.  Psychiatric:  . Mental status o Mood, affect appropriate  I have personally reviewed the following:   Labs:  BC pending  Scheduled Meds: . cloNIDine  0.1 mg Oral BH-qamhs   Followed by  . [START ON 10/28/2017] cloNIDine  0.1 mg Oral QAC breakfast  . enoxaparin (LOVENOX) injection  40 mg Subcutaneous Q24H   Continuous Infusions: . cefTRIAXone (ROCEPHIN)  IV Stopped (10/26/17 1426)  . vancomycin Stopped (10/26/17 1556)    Principal Problem:   Septic embolism (HCC) Active Problems:   Opioid dependence (HCC)   Polysubstance abuse (HCC)   Cellulitis of left hand   Heart murmur   Cellulitis of right foot without toes   IVDU (intravenous drug user)   Normocytic anemia   Elevated transaminase level   LOS: 4 days

## 2017-10-28 LAB — VANCOMYCIN, PEAK: Vancomycin Pk: 51 ug/mL (ref 30–40)

## 2017-10-28 MED ORDER — VANCOMYCIN HCL 10 G IV SOLR
1250.0000 mg | INTRAVENOUS | Status: DC
  Administered 2017-10-28: 1250 mg via INTRAVENOUS
  Filled 2017-10-28 (×2): qty 1250

## 2017-10-28 NOTE — Progress Notes (Signed)
Patient scheduled for TEE @ [facility]. Spoke with Leeroy Bockhelsea, RN to arrange carelink for arrival @ [facility] @ 1030 for an 1200 procedure.

## 2017-10-28 NOTE — Progress Notes (Signed)
MD notified of critical vanc level.

## 2017-10-28 NOTE — Progress Notes (Addendum)
PROGRESS NOTE    Margaret [name]  ZOX:[id number] DOB: [birth year] DOA: 10/23/2017 PCP: System, Provider Not In   Brief Narrative: 41yow PMH IVDU presented with sore throat x2 weeks, left hand, left foot erythema and pain. Febrile and tachycardic in ED, treated with IV abx. Admitted for cellulitis left hand, RLE. Developed heroin withdrawal. Hand and foot cellulitis improving. CT chest showed lung nodules concerning for septic emboli. ID recommended TEE and will make recs based on study.   Assessment & Plan:   Principal Problem:   Septic embolism (HCC) Active Problems:   Opioid dependence (HCC)   Polysubstance abuse (HCC)   Cellulitis of left hand   Heart murmur   Cellulitis of right foot without toes   IVDU (intravenous drug user)   Normocytic anemia   Elevated transaminase level  Cellulitis left hand, right foot/ankle in context of IVDU. Plain films negative. Hepatitis and HIV screens negative. -improving, persistent erythema to dorsum of R foot - continue empiric abx per ID (discussed vanc level with pharmacy, planning on holding AM dose until trough results)  Bilateral lung nodules concerning for septic emboli in context of IVDU - TTE negative. Agree with plans for TEE per ID  discussed with cardmaster today - continue empiric abx  Heart murmur (known history). Echo showed mild MR and mild-to-moderate TR. TEE pending.  Polysubstance abuse, IVDU with heroin withdrawal.  - On clonidine protocol.   Elevated transaminases - stable/trending down. Etiology unclear, likely secondary to drug use.  Chronic normocytic anemia, stable  Chest pain - EKG nonacute. Troponin negative. CT chest negative for PE. Lipase WNL. Echo unremarkable. Suspect secondary to withdrawal and septic emboli  Low back pain. MRI lumbar spine negative. - continue supportive care  DVT prophylaxis: lovenox Code Status: full  Family Communication: none at bedside Disposition Plan:  pending   Consultants:   ID  Cardiology  Procedures: (Don't include imaging studies which can be auto populated. Include things that cannot be auto populated i.e. Echo, Carotid and venous dopplers, Foley, Bipap, HD, tubes/drains, wound vac, central lines etc) Echo 12/16 Impressions:  - Normal LV wall thickness with LVEF 55-60% and normal diastolic   function. Mildly thickened mitral leaflets with mild mitral   regurgitation. Upper normal left atrial chamber size. Mild to   moderate tricuspid regurgitation with PASP estimated 37 mmHg.   Evidence of increased CVP noted  12/17 LE [place] Final Interpretation Right: There is no evidence of deep vein thrombosis in the lower extremity.There is no evidence of superficial venous thrombosis. No cystic structure found in the popliteal fossa. Left: There is no evidence of deep vein thrombosis in the lower extremity.There is no evidence of superficial venous thrombosis. No cystic structure found in the popliteal fossa.  Antimicrobials: (specify start and planned stop date. Auto populated tables are space occupying and do not give end dates)  Zosyn 12/15-12/17  Vancomycin 12/15 -   Ceftriaxone 12/17 -     Subjective: Persistent CP and lower back pain. She feels LBP may be due to withdrawal.  Objective: Vitals:   10/27/17 1232 10/27/17 2243 10/28/17 0507 10/28/17 0814  BP: 107/71 (!) 146/83 (!) 138/97 (!) 147/88  Pulse: 65 (!) 57 (!) 54 (!) 46  Resp: Temp: 98.4 F (36.9 C) 98.6 F (37 C) 98.6 F (37 C)   TempSrc: Oral Oral Oral   SpO2: 99% 99% 99%   Weight:      Height:  Intake/Output Summary (Last 24 hours) at 10/28/2017 1119 Last data filed at 10/27/2017 2200 Gross per 24 hour  Intake 540 ml  Output 1 ml  Net 539 ml   Filed Weights   10/23/17 2318  Weight: 61.2 kg (135 lb)    Examination:  General exam: Appears calm and comfortable  Respiratory system: Clear to auscultation. Respiratory effort  normal. Cardiovascular system: S1 & S2 heard, RRR. No JVD, murmurs, rubs, gallops or clicks. No pedal edema. Gastrointestinal system: Abdomen is nondistended, soft and nontender. No organomegaly or masses felt. Normal bowel sounds heard. MSK: paraspinal TTP in lumbar region, no midline tenderness Central nervous system: Alert and oriented. No focal neurological deficits. Extremities: Symmetric 5 x 5 power. Skin: track marks on arms and legs.  L hand without notable erythema.  Erythema persistent to R foot on dorsum. Psychiatry: Judgement and insight appear normal. Mood & affect appropriate.     Data Reviewed: I have personally reviewed following labs and imaging studies  CBC: Recent Labs  Lab 10/23/17 1805 10/24/17 0632 10/25/17 1437 10/26/17 0533  WBC 10.5 4.7 8.3 8.3  NEUTROABS 9.2* 3.9  --   --   HGB 10.9* 9.5* 10.2* 10.8*  HCT 32.4* 29.4* 31.7* 33.3*  MCV 83.9 85.5 86.4 86.0  PLT 175 102* 196 184   Basic Metabolic Panel: Recent Labs  Lab 10/23/17 1805 10/23/17 2212 10/24/17 0632 10/25/17 0918 10/26/17 0533  NA 137  --  141 141 143  K 3.0*  --  3.3* 4.1 4.0  CL 105  --  112* 115* 117*  CO2 25  --  24 21* 22  GLUCOSE 136*  --  217* 135* 94  BUN 10  --  13 14 9   CREATININE 0.60  --  0.56 0.58 0.66  CALCIUM 8.8*  --  8.5* 8.3* 8.6*  MG  --  1.9  --   --   --    GFR: Estimated Creatinine Clearance: 77.2 mL/min (by C-G formula based on SCr of 0.66 mg/dL). Liver Function Tests: Recent Labs  Lab 10/24/17 [id number] 10/25/17 0918 10/26/17 0533  AST 83* 83* 44*  ALT 57* 67* 58*  ALKPHOS 68 71 73  BILITOT 0.9 0.7 0.8  PROT 6.1* 5.4* 5.7*  ALBUMIN 3.0* 2.5* 2.7*   Recent Labs  Lab 10/25/17 0918  LIPASE 29   No results for input(s): AMMONIA in the last 168 hours. Coagulation Profile: No results for input(s): INR, PROTIME in the last 168 hours. Cardiac Enzymes: Recent Labs  Lab 10/25/17 0918  TROPONINI <0.03   BNP (last 3 results) No results for input(s):  PROBNP in the last 8760 hours. HbA1C: No results for input(s): HGBA1C in the last 72 hours. CBG: No results for input(s): GLUCAP in the last 168 hours. Lipid Profile: No results for input(s): CHOL, HDL, LDLCALC, TRIG, CHOLHDL, LDLDIRECT in the last 72 hours. Thyroid Function Tests: No results for input(s): TSH, T4TOTAL, FREET4, T3FREE, THYROIDAB in the last 72 hours. Anemia Panel: No results for input(s): VITAMINB12, FOLATE, FERRITIN, TIBC, IRON, RETICCTPCT in the last 72 hours. Sepsis Labs: Recent Labs  Lab 10/23/17 1906 10/23/17 2019  LATICACIDVEN 1.06 0.52    Recent Results (from the past 240 hour(s))  Blood culture (routine x 2)     Status: None (Preliminary result)   Collection Time: 10/23/17  6:05 PM  Result Value Ref Range Status   Specimen Description BLOOD BLOOD RIGHT FOREARM  Final   Special Requests   Final    BOTTLES  DRAWN AEROBIC AND ANAEROBIC Blood Culture adequate volume   Culture   Final    NO GROWTH 3 DAYS Performed at [facility], [address], [place], [place] [address]    Report Status PENDING  Incomplete  Blood culture (routine x 2)     Status: None (Preliminary result)   Collection Time: 10/23/17 10:12 PM  Result Value Ref Range Status   Specimen Description BLOOD RIGHT HAND  Final   Special Requests IN PEDIATRIC BOTTLE Blood Culture adequate volume  Final   Culture   Final    NO GROWTH 3 DAYS Performed at [facility], [address] [place], [place] [address]    Report Status PENDING  Incomplete  Culture, blood (routine x 2)     Status: None (Preliminary result)   Collection Time: 10/26/17  2:29 PM  Result Value Ref Range Status   Specimen Description BLOOD LEFT HAND  Final   Special Requests IN PEDIATRIC BOTTLE Blood Culture adequate volume  Final   Culture   Final    NO GROWTH < 24 HOURS Performed at [facility], [address] [place], [place] [address]    Report Status PENDING  Incomplete  Culture, blood (routine x  2)     Status: None (Preliminary result)   Collection Time: 10/26/17  2:41 PM  Result Value Ref Range Status   Specimen Description BLOOD LEFT HAND  Final   Special Requests IN PEDIATRIC BOTTLE Blood Culture adequate volume  Final   Culture   Final    NO GROWTH < 24 HOURS Performed at [facility], [address], [place], [place] [address]    Report Status PENDING  Incomplete  MRSA PCR Screening     Status: Abnormal   Collection Time: 10/27/17  8:31 AM  Result Value Ref Range Status   MRSA by PCR POSITIVE (Seana Underwood) NEGATIVE Final    Comment:        The GeneXpert MRSA Assay (FDA approved for NASAL specimens only), is one component of [facility] Hintze comprehensive MRSA colonization surveillance program. It is not intended to diagnose MRSA infection nor to guide or monitor treatment for MRSA infections. RESULT CALLED TO, READ BACK BY AND VERIFIED WITH: R.GREENAWALT AT 1446 ON 10/27/17 BY N.THOMPSON          Radiology Studies: No results found.      Scheduled Meds: . Chlorhexidine Gluconate Cloth  6 each Topical Q0600  . cloNIDine  0.1 mg Oral QAC breakfast  . enoxaparin (LOVENOX) injection  40 mg Subcutaneous Q24H  . mupirocin ointment  1 application Nasal BID   Continuous Infusions: . cefTRIAXone (ROCEPHIN)  IV Stopped (10/27/17 1437)  . vancomycin Stopped (10/27/17 1619)     LOS: 5 days    Time spent: over 30 min    Lacretia Nicks, MD [facility] Pager [phone]  If 7PM-7AM, please contact night-coverage [email or web address] Password [email or web address] 10/28/2017, 11:19 AM

## 2017-10-28 NOTE — Progress Notes (Signed)
Pharmacy Antibiotic Note  Margaret [name] is a 41 y.o. female with sore throat and fever admitted on 10/23/2017 with cellulitis of the left hand.  Pharmacy has been consulted for vancomycin dosing.  Today, 10/28/2017 Day #5 full abx - vancomycin and ceftriaxone Afebrile WBC wnl SCr stable, CrCl 77 ml/min Cultures pending  Plan: Continue Ceftriaxone 2g IV q24h (no further dosage adjustment needed) Continue vancomycin 1250 mg IV q24h for est AUC=473 Goal AUC 400-500 Obtain vancomycin peak today and trough before tomorrow's dose Daily scr F/u cultures/levels and ID recommendations  Height: 5' (152.4 cm) Weight: 135 lb (61.2 kg) IBW/kg (Calculated) : 45.5  Temp (24hrs), Avg:98.5 F (36.9 C), Min:98.4 F (36.9 C), Max:98.6 F (37 C)  Recent Labs  Lab 10/23/17 1805 10/23/17 1906 10/23/17 2019 10/24/17 0632 10/25/17 0918 10/25/17 1437 10/26/17 0533  WBC 10.5  --   --  4.7  --  8.3 8.3  CREATININE 0.60  --   --  0.56 0.58  --  0.66  LATICACIDVEN  --  1.06 0.52  --   --   --   --     Estimated Creatinine Clearance: 77.2 mL/min (by C-G formula based on SCr of 0.66 mg/dL).    No Known Allergies  Antimicrobials this admission: 12/15 zosyn >> 12/17 12/15 vancomycin >>  12/17 ceftriaxone >>  Dose adjustments this admission:  Microbiology results: 12/15 BCx x2:  12/15 MRSA PCR:   Thank you for allowing pharmacy to be a part of this patient's care.  Arley Phenixllen Travante Knee RPh 10/28/2017, 12:30 PM Pager [phone]

## 2017-10-28 NOTE — Progress Notes (Signed)
    [facility] has been requested to perform a transesophageal echocardiogram on Margaret [name] for septic emboli to lungs with IVDU.  After careful review of history and examination, the risks and benefits of transesophageal echocardiogram have been explained including risks of esophageal damage, perforation (1:10,000 risk), bleeding, pharyngeal hematoma as well as other potential complications associated with conscious sedation including aspiration, arrhythmia, respiratory failure and death. Alternatives to treatment were discussed, questions were answered. Patient is willing to proceed. Scheduled for 10/29/17 at 09-1299 with Dr. Tad Mooreoss  Kinzi Frediani, NP  10/28/2017 1:07 PM

## 2017-10-29 ENCOUNTER — Encounter (HOSPITAL_COMMUNITY)
Admission: EM | Disposition: A | Discharge status: Home / Self Care | Payer: Self-pay | Source: Home / Self Care | Attending: Family Medicine

## 2017-10-29 ENCOUNTER — Inpatient Hospital Stay (HOSPITAL_COMMUNITY)
Admit: 2017-10-29 | Discharge: 2017-10-29 | Disposition: A | Discharge status: Home / Self Care | Payer: Self-pay | Attending: Internal Medicine | Admitting: Internal Medicine

## 2017-10-29 ENCOUNTER — Inpatient Hospital Stay (HOSPITAL_COMMUNITY): Payer: Self-pay | Admitting: Anesthesiology

## 2017-10-29 DIAGNOSIS — R7881 Bacteremia: Secondary | ICD-10-CM

## 2017-10-29 HISTORY — PX: TEE WITHOUT CARDIOVERSION: SHX5443

## 2017-10-29 LAB — CULTURE, BLOOD (ROUTINE X 2)
CULTURE: NO GROWTH
Culture: NO GROWTH
SPECIAL REQUESTS: ADEQUATE
Special Requests: ADEQUATE

## 2017-10-29 LAB — CBC
HCT: 37.2 % (ref 36.0–46.0)
Hemoglobin: 12.3 g/dL (ref 12.0–15.0)
MCH: 27.7 pg (ref 26.0–34.0)
MCHC: 33.1 g/dL (ref 30.0–36.0)
MCV: 83.8 fL (ref 78.0–100.0)
PLATELETS: 234 10*3/uL (ref 150–400)
RBC: 4.44 MIL/uL (ref 3.87–5.11)
RDW: 14.1 % (ref 11.5–15.5)
WBC: 5.3 10*3/uL (ref 4.0–10.5)

## 2017-10-29 LAB — COMPREHENSIVE METABOLIC PANEL
ALK PHOS: 77 U/L (ref 38–126)
ALT: 31 U/L (ref 14–54)
AST: 18 U/L (ref 15–41)
Albumin: 3.2 g/dL — ABNORMAL LOW (ref 3.5–5.0)
Anion gap: 8 (ref 5–15)
BUN: 9 mg/dL (ref 6–20)
CALCIUM: 9 mg/dL (ref 8.9–10.3)
CHLORIDE: 107 mmol/L (ref 101–111)
CO2: 24 mmol/L (ref 22–32)
CREATININE: 0.66 mg/dL (ref 0.44–1.00)
GFR calc Af Amer: >60 mL/min (ref >60)
Glucose, Bld: 88 mg/dL (ref 65–99)
Potassium: 3.6 mmol/L (ref 3.5–5.1)
Sodium: 139 mmol/L (ref 135–145)
Total Bilirubin: 0.7 mg/dL (ref 0.3–1.2)
Total Protein: 6.7 g/dL (ref 6.5–8.1)

## 2017-10-29 LAB — PROTIME-INR
INR: 1
Prothrombin Time: 13.1 seconds (ref 11.4–15.2)

## 2017-10-29 LAB — VANCOMYCIN, TROUGH: VANCOMYCIN TR: 5 ug/mL — AB (ref 15–20)

## 2017-10-29 SURGERY — ECHOCARDIOGRAM, TRANSESOPHAGEAL
Anesthesia: Monitor Anesthesia Care

## 2017-10-29 MED ORDER — PHENOL 1.4 % MT LIQD
1.0000 | OROMUCOSAL | Status: DC | PRN
  Administered 2017-10-29: 1 via OROMUCOSAL
  Filled 2017-10-29: qty 177

## 2017-10-29 MED ORDER — PROPOFOL 10 MG/ML IV BOLUS
INTRAVENOUS | Status: DC | PRN
  Administered 2017-10-29 (×6): 20 mg via INTRAVENOUS

## 2017-10-29 MED ORDER — LACTATED RINGERS IV SOLN
INTRAVENOUS | Status: AC | PRN
  Administered 2017-10-29: 1000 mL via INTRAVENOUS

## 2017-10-29 MED ORDER — PROPOFOL 500 MG/50ML IV EMUL
INTRAVENOUS | Status: DC | PRN
  Administered 2017-10-29: 75 ug/kg/min via INTRAVENOUS

## 2017-10-29 MED ORDER — FENTANYL CITRATE (PF) 100 MCG/2ML IJ SOLN
INTRAMUSCULAR | Status: DC | PRN
  Administered 2017-10-29 (×2): 50 ug via INTRAVENOUS

## 2017-10-29 MED ORDER — LINEZOLID 600 MG PO TABS
600.0000 mg | ORAL_TABLET | Freq: Two times a day (BID) | ORAL | Status: DC
  Administered 2017-10-29 – 2017-10-30 (×3): 600 mg via ORAL
  Filled 2017-10-29 (×3): qty 1

## 2017-10-29 MED ORDER — KETAMINE HCL-SODIUM CHLORIDE 100-0.9 MG/10ML-% IV SOSY
PREFILLED_SYRINGE | INTRAVENOUS | Status: AC
  Filled 2017-10-29: qty 10

## 2017-10-29 MED ORDER — VANCOMYCIN HCL 10 G IV SOLR
1250.0000 mg | INTRAVENOUS | Status: DC
  Filled 2017-10-29: qty 1250

## 2017-10-29 MED ORDER — LIDOCAINE 2% (20 MG/ML) 5 ML SYRINGE
INTRAMUSCULAR | Status: DC | PRN
  Administered 2017-10-29: 40 mg via INTRAVENOUS

## 2017-10-29 MED ORDER — KETAMINE HCL 10 MG/ML IJ SOLN
INTRAMUSCULAR | Status: DC | PRN
  Administered 2017-10-29: 20 mg via INTRAVENOUS

## 2017-10-29 NOTE — Progress Notes (Signed)
PROGRESS NOTE    Margaret [name]  WUJ:[id number] DOB: 1975/12/26 DOA: 10/23/2017 PCP: System, Provider Not In   Brief Narrative: 41yow PMH IVDU presented with sore throat x2 weeks, left hand, left foot erythema and pain. Febrile and tachycardic in ED, treated with IV abx. Admitted for cellulitis left hand, RLE. Developed heroin withdrawal. Hand and foot cellulitis improving. CT chest showed lung nodules concerning for septic emboli. ID recommended TEE and will make recs based on study.   Assessment & Plan:   Principal Problem:   Septic embolism (HCC) Active Problems:   Opioid dependence (HCC)   Polysubstance abuse (HCC)   Cellulitis of left hand   Heart murmur   Cellulitis of right foot without toes   IVDU (intravenous drug user)   Normocytic anemia   Elevated transaminase level  Cellulitis left hand, right foot/ankle in context of IVDU. Plain films negative. Hepatitis and HIV screens negative. -improving, persistent erythema to dorsum of R foot - continue empiric abx per ID (discussed vanc level with pharmacy, planning on holding dose today until trough results)  Bilateral lung nodules concerning for septic emboli in context of IVDU - TTE negative. Agree with plans for TEE per ID [ ]  TEE today - continue empiric abx  Heart murmur (known history). Echo showed mild MR and mild-to-moderate TR. TEE pending.  Polysubstance abuse, IVDU with heroin withdrawal.  - On clonidine protocol.   Elevated transaminases - stable/trending down. Etiology unclear, likely secondary to drug use.  Chronic normocytic anemia, stable  Chest pain - EKG nonacute. Troponin negative. CT chest negative for PE. Lipase WNL. Echo unremarkable. Suspect secondary to withdrawal and septic emboli  Low back pain. MRI lumbar spine negative. - continue supportive care  DVT prophylaxis: lovenox Code Status: full  Family Communication: none at bedside Disposition Plan:  pending   Consultants:   ID  Cardiology  Procedures: (Don't include imaging studies which can be auto populated. Include things that cannot be auto populated i.e. Echo, Carotid and venous dopplers, Foley, Bipap, HD, tubes/drains, wound vac, central lines etc) Echo 12/16 Impressions:  - Normal LV wall thickness with LVEF 55-60% and normal diastolic   function. Mildly thickened mitral leaflets with mild mitral   regurgitation. Upper normal left atrial chamber size. Mild to   moderate tricuspid regurgitation with PASP estimated 37 mmHg.   Evidence of increased CVP noted  12/17 LE US Final Interpretation Right: There is no evidence of deep vein thrombosis in the lower extremity.There is no evidence of superficial venous thrombosis. No cystic structure found in the popliteal fossa. Left: There is no evidence of deep vein thrombosis in the lower extremity.There is no evidence of superficial venous thrombosis. No cystic structure found in the popliteal fossa.  Antimicrobials: (specify start and planned stop date. Auto populated tables are space occupying and do not give end dates)  Zosyn 12/15-12/17  Vancomycin 12/15 -   Ceftriaxone 12/17 -     Subjective: Some persistent CP, otherwise no SOB. No complaints.  Objective: Vitals:   10/28/17 0507 10/28/17 0814 10/28/17 1410 10/29/17 0544  BP: (!) 138/97 (!) 147/88 127/83 (!) 150/81  Pulse: (!) 54 (!) 46 (!) 48 (!) 51  Resp: 18  16 16   Temp: 98.6 F (37 C)  97.8 F (36.6 C) 98.2 F (36.8 C)  TempSrc: Oral  Oral Oral  SpO2: 99%  99% 100%  Weight:      Height:       No intake or output data in the  24 hours ending 10/29/17 1013 Filed Weights   10/23/17 2318  Weight: 61.2 kg (135 lb)    Examination:  General: No acute distress. Cardiovascular: Heart sounds show a regular rate, and rhythm. No gallops or rubs. No murmurs. No JVD. Lungs: Clear to auscultation bilaterally with good air movement. No rales, rhonchi or  wheezes. Abdomen: Soft, nontender, nondistended with normal active bowel sounds. No masses. No hepatosplenomegaly. Neurological: Alert and oriented 3. Moves all extremities 4 with equal strength. Cranial nerves II through XII grossly intact. Skin: R foot erythema.  Track marks. Extremities: No clubbing or cyanosis. No edema.  Psychiatric: Mood and affect are normal. Insight and judgment are appropriate.    Data Reviewed: I have personally reviewed following labs and imaging studies  CBC: Recent Labs  Lab 10/23/17 1805 10/24/17 0632 10/25/17 1437 10/26/17 0533 10/29/17 0628  WBC 10.5 4.7 8.3 8.3 5.3  NEUTROABS 9.2* 3.9  --   --   --   HGB 10.9* 9.5* 10.2* 10.8* 12.3  HCT 32.4* 29.4* 31.7* 33.3* 37.2  MCV 83.9 85.5 86.4 86.0 83.8  PLT 175 102* 196 184 234   Basic Metabolic Panel: Recent Labs  Lab 10/23/17 1805 10/23/17 2212 10/24/17 0632 10/25/17 0918 10/26/17 0533 10/29/17 0628  NA 137  --  141 141 143 139  K 3.0*  --  3.3* 4.1 4.0 3.6  CL 105  --  112* 115* 117* 107  CO2 25  --  24 21* 22 24  GLUCOSE 136*  --  217* 135* 94 88  BUN 10  --  13 14 9 9   CREATININE 0.60  --  0.56 0.58 0.66 0.66  CALCIUM 8.8*  --  8.5* 8.3* 8.6* 9.0  MG  --  1.9  --   --   --   --    GFR: Estimated Creatinine Clearance: 77.2 mL/min (by C-G formula based on SCr of 0.66 mg/dL). Liver Function Tests: Recent Labs  Lab 10/24/17 [id number] 10/25/17 0918 10/26/17 0533 10/29/17 0628  AST 83* 83* 44* 18  ALT 57* 67* 58* 31  ALKPHOS 68 71 73 77  BILITOT 0.9 0.7 0.8 0.7  PROT 6.1* 5.4* 5.7* 6.7  ALBUMIN 3.0* 2.5* 2.7* 3.2*   Recent Labs  Lab 10/25/17 0918  LIPASE 29   No results for input(s): AMMONIA in the last 168 hours. Coagulation Profile: Recent Labs  Lab 10/29/17 0628  INR 1.00   Cardiac Enzymes: Recent Labs  Lab 10/25/17 0918  TROPONINI <0.03   BNP (last 3 results) No results for input(s): PROBNP in the last 8760 hours. HbA1C: No results for input(s): HGBA1C in the  last 72 hours. CBG: No results for input(s): GLUCAP in the last 168 hours. Lipid Profile: No results for input(s): CHOL, HDL, LDLCALC, TRIG, CHOLHDL, LDLDIRECT in the last 72 hours. Thyroid Function Tests: No results for input(s): TSH, T4TOTAL, FREET4, T3FREE, THYROIDAB in the last 72 hours. Anemia Panel: No results for input(s): VITAMINB12, FOLATE, FERRITIN, TIBC, IRON, RETICCTPCT in the last 72 hours. Sepsis Labs: Recent Labs  Lab 10/23/17 1906 10/23/17 2019  LATICACIDVEN 1.06 0.52    Recent Results (from the past 240 hour(s))  Blood culture (routine x 2)     Status: None (Preliminary result)   Collection Time: 10/23/17  6:05 PM  Result Value Ref Range Status   Specimen Description BLOOD BLOOD RIGHT FOREARM  Final   Special Requests   Final    BOTTLES DRAWN AEROBIC AND ANAEROBIC Blood Culture adequate volume  Culture   Final    NO GROWTH 4 DAYS Performed at [facility], [address], [place], [place] [address]    Report Status PENDING  Incomplete  Blood culture (routine x 2)     Status: None (Preliminary result)   Collection Time: 10/23/17 10:12 PM  Result Value Ref Range Status   Specimen Description BLOOD RIGHT HAND  Final   Special Requests IN PEDIATRIC BOTTLE Blood Culture adequate volume  Final   Culture   Final    NO GROWTH 4 DAYS Performed at [facility], [address], [place], [place] [address]    Report Status PENDING  Incomplete  Culture, blood (routine x 2)     Status: None (Preliminary result)   Collection Time: 10/26/17  2:29 PM  Result Value Ref Range Status   Specimen Description BLOOD LEFT HAND  Final   Special Requests IN PEDIATRIC BOTTLE Blood Culture adequate volume  Final   Culture   Final    NO GROWTH 2 DAYS Performed at [facility], [address], [place], [place] [address]    Report Status PENDING  Incomplete  Culture, blood (routine x 2)     Status: None (Preliminary result)   Collection Time: 10/26/17  2:41 PM   Result Value Ref Range Status   Specimen Description BLOOD LEFT HAND  Final   Special Requests IN PEDIATRIC BOTTLE Blood Culture adequate volume  Final   Culture   Final    NO GROWTH 2 DAYS Performed at [facility], [address] [place], [place] [address]    Report Status PENDING  Incomplete  MRSA PCR Screening     Status: Abnormal   Collection Time: 10/27/17  8:31 AM  Result Value Ref Range Status   MRSA by PCR POSITIVE (A) NEGATIVE Final    Comment:        The GeneXpert MRSA Assay (FDA approved for NASAL specimens only), is one component of a comprehensive MRSA colonization surveillance program. It is not intended to diagnose MRSA infection nor to guide or monitor treatment for MRSA infections. RESULT CALLED TO, READ BACK BY AND VERIFIED WITH: R.GREENAWALT AT 1446 ON 10/27/17 BY N.THOMPSON          Radiology Studies: No results found.      Scheduled Meds: . Chlorhexidine Gluconate Cloth  6 each Topical Q0600  . cloNIDine  0.1 mg Oral QAC breakfast  . enoxaparin (LOVENOX) injection  40 mg Subcutaneous Q24H  . mupirocin ointment  1 application Nasal BID   Continuous Infusions: . cefTRIAXone (ROCEPHIN)  IV Stopped (10/28/17 1425)  . vancomycin Stopped (10/28/17 1548)     LOS: 6 days    Time spent: over 20 min    Lacretia Nicks, MD [facility] Pager [phone]  If 7PM-7AM, please contact night-coverage [email or web address] Password [facility] 10/29/2017, 10:13 AM

## 2017-10-29 NOTE — Interval H&P Note (Signed)
History and Physical Interval Note:  10/29/2017 10:54 AM  Margaret [name]  has presented today for surgery, with the diagnosis of bacteremia  The various methods of treatment have been discussed with the patient and family. After consideration of risks, benefits and other options for treatment, the patient has consented to  Procedure(s): TRANSESOPHAGEAL ECHOCARDIOGRAM (TEE) (N/A) as a surgical intervention .  The patient's history has been reviewed, patient examined, no change in status, stable for surgery.  I have reviewed the patient's chart and labs.  Questions were answered to the patient's satisfaction.     Dietrich PatesPaula Daune Colgate

## 2017-10-29 NOTE — Transfer of Care (Signed)
Immediate Anesthesia Transfer of Care Note  Patient: SKARLETTE LATTNER  Procedure(s) Performed: TRANSESOPHAGEAL ECHOCARDIOGRAM (TEE) (N/A )  Patient Location: Endoscopy Unit  Anesthesia Type:MAC  Level of Consciousness: drowsy and patient cooperative  Airway & Oxygen Therapy: Patient Spontanous Breathing and Patient connected to nasal cannula oxygen  Post-op Assessment: Report given to RN, Post -op Vital signs reviewed and stable and Patient moving all extremities X 4  Post vital signs: Reviewed and stable  Last Vitals:  Vitals:   10/29/17 1046 10/29/17 1222  BP: (!) 170/91 131/81  Pulse: (!) 54 72  Resp: 14 (!) 21  Temp: 36.9 C 36.9 C  SpO2: 99% 93%    Last Pain:  Vitals:   10/29/17 1222  TempSrc: Oral  PainSc:       Patients Stated Pain Goal: 2 (76/81/15 7262)  Complications: No apparent anesthesia complications

## 2017-10-29 NOTE — Op Note (Signed)
TEE Report  AV mildly thickened.  No obvious vegetation  MV normal  No vegetation  Trace MR  TV normal  Triv TR  PV normal    LVEF and RVEF normal  LA, LAA without masses  NO PFO by color doppler or with injection of agitated saline  Normal thoracic aorta.    Full report to follow

## 2017-10-29 NOTE — Progress Notes (Signed)
[facility]   Reason for visit: Follow up on cellulitis  Interval History: TEE done and no vegetation, normal TV (trace regurg) and no significant abnormalities.  No fever.    Physical Exam: Constitutional:  Vitals:   10/29/17 1240 10/29/17 1336  BP: 131/82 140/88  Pulse: 63 (!) 56  Resp: 14 15  Temp:  98.1 F (36.7 C)  SpO2: 95% 99%   patient appears in NAD   Review of Systems: Constitutional: negative for fevers and chills  Lab Results  Component Value Date   WBC 5.3 10/29/2017   HGB 12.3 10/29/2017   HCT 37.2 10/29/2017   MCV 83.8 10/29/2017   PLT 234 10/29/2017    Lab Results  Component Value Date   CREATININE 0.66 10/29/2017   BUN 9 10/29/2017   NA 139 10/29/2017   K 3.6 10/29/2017   CL 107 10/29/2017   CO2 24 10/29/2017    Lab Results  Component Value Date   ALT 31 10/29/2017   AST 18 10/29/2017   ALKPHOS 77 10/29/2017     Microbiology: Recent Results (from the past 240 hour(s))  Blood culture (routine x 2)     Status: None   Collection Time: 10/23/17  6:05 PM  Result Value Ref Range Status   Specimen Description BLOOD BLOOD RIGHT FOREARM  Final   Special Requests   Final    BOTTLES DRAWN AEROBIC AND ANAEROBIC Blood Culture adequate volume   Culture   Final    NO GROWTH 5 DAYS Performed at [facility], [address] [place], [place] [address]    Report Status 10/29/2017 FINAL  Final  Blood culture (routine x 2)     Status: None   Collection Time: 10/23/17 10:12 PM  Result Value Ref Range Status   Specimen Description BLOOD RIGHT HAND  Final   Special Requests IN PEDIATRIC BOTTLE Blood Culture adequate volume  Final   Culture   Final    NO GROWTH 5 DAYS Performed at [facility], [address] [place], [place] [address]    Report Status 10/29/2017 FINAL  Final  Culture, blood (routine x 2)     Status: None (Preliminary result)   Collection Time: 10/26/17  2:29 PM  Result Value Ref Range Status    Specimen Description BLOOD LEFT HAND  Final   Special Requests IN PEDIATRIC BOTTLE Blood Culture adequate volume  Final   Culture   Final    NO GROWTH 2 DAYS Performed at [facility], [address] [place], [place] [address]    Report Status PENDING  Incomplete  Culture, blood (routine x 2)     Status: None (Preliminary result)   Collection Time: 10/26/17  2:41 PM  Result Value Ref Range Status   Specimen Description BLOOD LEFT HAND  Final   Special Requests IN PEDIATRIC BOTTLE Blood Culture adequate volume  Final   Culture   Final    NO GROWTH 2 DAYS Performed at [facility], [address] [place], [place] [address]    Report Status PENDING  Incomplete  MRSA PCR Screening     Status: Abnormal   Collection Time: 10/27/17  8:31 AM  Result Value Ref Range Status   MRSA by PCR POSITIVE (A) NEGATIVE Final    Comment:        The GeneXpert MRSA Assay (FDA approved for NASAL specimens only), is one component of a comprehensive MRSA colonization  surveillance program. It is not intended to diagnose MRSA infection nor to guide or monitor treatment for MRSA infections. RESULT CALLED TO, READ BACK BY AND VERIFIED WITH: R.GREENAWALT AT 1446 ON 10/27/17 BY N.THOMPSON     Impression/Plan:  1. Cellulitis - improved.  Continue on antibiotics 2. Pulmonary nodules - unclear etiology.  TEE is negative for any vegetation and the TV (and other valves) really with no dysfunction on TEE (was mild-moderate on TTE).    I would transition to oral linezolid 600 mg bid for 2 weeks. -should get a CBC in one week I have her scheduled with me January 8th with me  I will sign off, please call with questions. thanks

## 2017-10-29 NOTE — Progress Notes (Signed)
LCSW consulted for SA.  LCSW provided patient with outpatient and residential resources.   Patient is familiar with programs that accept no insurance.   LCSW called [facility] to get additional information for patient. L[place] [employer] funds. [facility] does not have beds available and has a waiting list.   Patient has been in outpatient programs this year and [facility] is unsure how that effects residential [employer] funds.   Patient will follow up with resources on her own.   LCSW signing off. No further CSW needs. Please submit new consult if CSW needs arise.   Beulah GandyBernette Phaedra Colgate, LSCW [place] CSW [phone]

## 2017-10-29 NOTE — Anesthesia Postprocedure Evaluation (Signed)
Anesthesia Post Note  Patient: Margaret [name]  Procedure(s) Performed: TRANSESOPHAGEAL ECHOCARDIOGRAM (TEE) (N/A )     Patient location during evaluation: Endoscopy Anesthesia Type: MAC Level of consciousness: oriented, awake and alert and patient cooperative Pain management: pain level controlled Vital Signs Assessment: post-procedure vital signs reviewed and stable Respiratory status: spontaneous breathing, nonlabored ventilation and respiratory function stable Cardiovascular status: blood pressure returned to baseline and stable Postop Assessment: no apparent nausea or vomiting Anesthetic complications: no    Last Vitals:  Vitals:   10/29/17 1240 10/29/17 1336  BP: 131/82 140/88  Pulse: 63 (!) 56  Resp: 14 15  Temp:  36.7 C  SpO2: 95% 99%    Last Pain:  Vitals:   10/29/17 1336  TempSrc: Oral  PainSc:                  Aveya Beal,E. Darice Vicario

## 2017-10-29 NOTE — Anesthesia Preprocedure Evaluation (Addendum)
Anesthesia Evaluation  Patient identified by MRN, date of birth, ID band Patient awake    Reviewed: Allergy & Precautions, NPO status , Patient's Chart, lab work & pertinent test results  History of Anesthesia Complications Negative for: history of anesthetic complications  Airway Mallampati: II  TM Distance: >3 FB Neck ROM: Full    Dental  (+) Poor Dentition, Missing, Dental Advisory Given   Pulmonary neg pulmonary ROS, former smoker,    breath sounds clear to auscultation       Cardiovascular (-) hypertensionnegative cardio ROS   Rhythm:Regular Rate:Normal  10/24/17 ECHO: EF 55-60%, mild MR, mod TR   Neuro/Psych negative neurological ROS     GI/Hepatic negative GI ROS, (+)     substance abuse (last use 6 days ago)  cocaine use, marijuana use and IV drug use,   Endo/Other  negative endocrine ROS  Renal/GU negative Renal ROS     Musculoskeletal   Abdominal   Peds  Hematology negative hematology ROS (+)   Anesthesia Other Findings   Reproductive/Obstetrics S/p BTL                            Anesthesia Physical Anesthesia Plan  ASA: II  Anesthesia Plan: MAC   Post-op Pain Management:    Induction:   PONV Risk Score and Plan: 2 and Treatment may vary due to age or medical condition  Airway Management Planned: Natural Airway and Nasal Cannula  Additional Equipment:   Intra-op Plan:   Post-operative Plan:   Informed Consent: I have reviewed the patients History and Physical, chart, labs and discussed the procedure including the risks, benefits and alternatives for the proposed anesthesia with the patient or authorized representative who has indicated his/her understanding and acceptance.   Dental advisory given  Plan Discussed with: CRNA and Surgeon  Anesthesia Plan Comments: (Plan routine monitors, MAC)        Anesthesia Quick Evaluation

## 2017-10-29 NOTE — H&P (View-Only) (Signed)
PROGRESS NOTE    Margaret [name]  WUJ:[id number] DOB: 1975/12/26 DOA: 10/23/2017 PCP: System, Provider Not In   Brief Narrative: 41yow PMH IVDU presented with sore throat x2 weeks, left hand, left foot erythema and pain. Febrile and tachycardic in ED, treated with IV abx. Admitted for cellulitis left hand, RLE. Developed heroin withdrawal. Hand and foot cellulitis improving. CT chest showed lung nodules concerning for septic emboli. ID recommended TEE and will make recs based on study.   Assessment & Plan:   Principal Problem:   Septic embolism (HCC) Active Problems:   Opioid dependence (HCC)   Polysubstance abuse (HCC)   Cellulitis of left hand   Heart murmur   Cellulitis of right foot without toes   IVDU (intravenous drug user)   Normocytic anemia   Elevated transaminase level  Cellulitis left hand, right foot/ankle in context of IVDU. Plain films negative. Hepatitis and HIV screens negative. -improving, persistent erythema to dorsum of R foot - continue empiric abx per ID (discussed vanc level with pharmacy, planning on holding dose today until trough results)  Bilateral lung nodules concerning for septic emboli in context of IVDU - TTE negative. Agree with plans for TEE per ID [ ]  TEE today - continue empiric abx  Heart murmur (known history). Echo showed mild MR and mild-to-moderate TR. TEE pending.  Polysubstance abuse, IVDU with heroin withdrawal.  - On clonidine protocol.   Elevated transaminases - stable/trending down. Etiology unclear, likely secondary to drug use.  Chronic normocytic anemia, stable  Chest pain - EKG nonacute. Troponin negative. CT chest negative for PE. Lipase WNL. Echo unremarkable. Suspect secondary to withdrawal and septic emboli  Low back pain. MRI lumbar spine negative. - continue supportive care  DVT prophylaxis: lovenox Code Status: full  Family Communication: none at bedside Disposition Plan:  pending   Consultants:   ID  Cardiology  Procedures: (Don't include imaging studies which can be auto populated. Include things that cannot be auto populated i.e. Echo, Carotid and venous dopplers, Foley, Bipap, HD, tubes/drains, wound vac, central lines etc) Echo 12/16 Impressions:  - Normal LV wall thickness with LVEF 55-60% and normal diastolic   function. Mildly thickened mitral leaflets with mild mitral   regurgitation. Upper normal left atrial chamber size. Mild to   moderate tricuspid regurgitation with PASP estimated 37 mmHg.   Evidence of increased CVP noted  12/17 LE US Final Interpretation Right: There is no evidence of deep vein thrombosis in the lower extremity.There is no evidence of superficial venous thrombosis. No cystic structure found in the popliteal fossa. Left: There is no evidence of deep vein thrombosis in the lower extremity.There is no evidence of superficial venous thrombosis. No cystic structure found in the popliteal fossa.  Antimicrobials: (specify start and planned stop date. Auto populated tables are space occupying and do not give end dates)  Zosyn 12/15-12/17  Vancomycin 12/15 -   Ceftriaxone 12/17 -     Subjective: Some persistent CP, otherwise no SOB. No complaints.  Objective: Vitals:   10/28/17 0507 10/28/17 0814 10/28/17 1410 10/29/17 0544  BP: (!) 138/97 (!) 147/88 127/83 (!) 150/81  Pulse: (!) 54 (!) 46 (!) 48 (!) 51  Resp: 18  16 16   Temp: 98.6 F (37 C)  97.8 F (36.6 C) 98.2 F (36.8 C)  TempSrc: Oral  Oral Oral  SpO2: 99%  99% 100%  Weight:      Height:       No intake or output data in the  24 hours ending 10/29/17 1013 Filed Weights   10/23/17 2318  Weight: 61.2 kg (135 lb)    Examination:  General: No acute distress. Cardiovascular: Heart sounds show Margaret [name] regular rate, and rhythm. No gallops or rubs. No murmurs. No JVD. Lungs: Clear to auscultation bilaterally with good air movement. No rales, rhonchi or  wheezes. Abdomen: Soft, nontender, nondistended with normal active bowel sounds. No masses. No hepatosplenomegaly. Neurological: Alert and oriented 3. Moves all extremities 4 with equal strength. Cranial nerves II through XII grossly intact. Skin: R foot erythema.  Track marks. Extremities: No clubbing or cyanosis. No edema.  Psychiatric: Mood and affect are normal. Insight and judgment are appropriate.    Data Reviewed: I have personally reviewed following labs and imaging studies  CBC: Recent Labs  Lab 10/23/17 1805 10/24/17 0632 10/25/17 1437 10/26/17 0533 10/29/17 0628  WBC 10.5 4.7 8.3 8.3 5.3  NEUTROABS 9.2* 3.9  --   --   --   HGB 10.9* 9.5* 10.2* 10.8* 12.3  HCT 32.4* 29.4* 31.7* 33.3* 37.2  MCV 83.9 85.5 86.4 86.0 83.8  PLT 175 102* 196 184 234   Basic Metabolic Panel: Recent Labs  Lab 10/23/17 1805 10/23/17 2212 10/24/17 0632 10/25/17 0918 10/26/17 0533 10/29/17 0628  NA 137  --  141 141 143 139  K 3.0*  --  3.3* 4.1 4.0 3.6  CL 105  --  112* 115* 117* 107  CO2 25  --  24 21* 22 24  GLUCOSE 136*  --  217* 135* 94 88  BUN 10  --  13 14 9 9   CREATININE 0.60  --  0.56 0.58 0.66 0.66  CALCIUM 8.8*  --  8.5* 8.3* 8.6* 9.0  MG  --  1.9  --   --   --   --    GFR: Estimated Creatinine Clearance: 77.2 mL/min (by C-G formula based on SCr of 0.66 mg/dL). Liver Function Tests: Recent Labs  Lab 10/24/17 [id number] 10/25/17 0918 10/26/17 0533 10/29/17 0628  AST 83* 83* 44* 18  ALT 57* 67* 58* 31  ALKPHOS 68 71 73 77  BILITOT 0.9 0.7 0.8 0.7  PROT 6.1* 5.4* 5.7* 6.7  ALBUMIN 3.0* 2.5* 2.7* 3.2*   Recent Labs  Lab 10/25/17 0918  LIPASE 29   No results for input(s): AMMONIA in the last 168 hours. Coagulation Profile: Recent Labs  Lab 10/29/17 0628  INR 1.00   Cardiac Enzymes: Recent Labs  Lab 10/25/17 0918  TROPONINI <0.03   BNP (last 3 results) No results for input(s): PROBNP in the last 8760 hours. HbA1C: No results for input(s): HGBA1C in the  last 72 hours. CBG: No results for input(s): GLUCAP in the last 168 hours. Lipid Profile: No results for input(s): CHOL, HDL, LDLCALC, TRIG, CHOLHDL, LDLDIRECT in the last 72 hours. Thyroid Function Tests: No results for input(s): TSH, T4TOTAL, FREET4, T3FREE, THYROIDAB in the last 72 hours. Anemia Panel: No results for input(s): VITAMINB12, FOLATE, FERRITIN, TIBC, IRON, RETICCTPCT in the last 72 hours. Sepsis Labs: Recent Labs  Lab 10/23/17 1906 10/23/17 2019  LATICACIDVEN 1.06 0.52    Recent Results (from the past 240 hour(s))  Blood culture (routine x 2)     Status: None (Preliminary result)   Collection Time: 10/23/17  6:05 PM  Result Value Ref Range Status   Specimen Description BLOOD BLOOD RIGHT FOREARM  Final   Special Requests   Final    BOTTLES DRAWN AEROBIC AND ANAEROBIC Blood Culture adequate volume  Culture   Final    NO GROWTH 4 DAYS Performed at [facility], [address], [place], [place] [address]    Report Status PENDING  Incomplete  Blood culture (routine x 2)     Status: None (Preliminary result)   Collection Time: 10/23/17 10:12 PM  Result Value Ref Range Status   Specimen Description BLOOD RIGHT HAND  Final   Special Requests IN PEDIATRIC BOTTLE Blood Culture adequate volume  Final   Culture   Final    NO GROWTH 4 DAYS Performed at [facility], [address], [place], [place] [address]    Report Status PENDING  Incomplete  Culture, blood (routine x 2)     Status: None (Preliminary result)   Collection Time: 10/26/17  2:29 PM  Result Value Ref Range Status   Specimen Description BLOOD LEFT HAND  Final   Special Requests IN PEDIATRIC BOTTLE Blood Culture adequate volume  Final   Culture   Final    NO GROWTH 2 DAYS Performed at [facility], [address], [place], [place] [address]    Report Status PENDING  Incomplete  Culture, blood (routine x 2)     Status: None (Preliminary result)   Collection Time: 10/26/17  2:41 PM   Result Value Ref Range Status   Specimen Description BLOOD LEFT HAND  Final   Special Requests IN PEDIATRIC BOTTLE Blood Culture adequate volume  Final   Culture   Final    NO GROWTH 2 DAYS Performed at [facility], [address] [place], [place] [address]    Report Status PENDING  Incomplete  MRSA PCR Screening     Status: Abnormal   Collection Time: 10/27/17  8:31 AM  Result Value Ref Range Status   MRSA by PCR POSITIVE ([place]) NEGATIVE Final    Comment:        The GeneXpert MRSA Assay (FDA approved for NASAL specimens only), is one component of [facility] comprehensive MRSA colonization surveillance program. It is not intended to diagnose MRSA infection nor to guide or monitor treatment for MRSA infections. RESULT CALLED TO, READ BACK BY AND VERIFIED WITH: R.GREENAWALT AT 1446 ON 10/27/17 BY N.[email or web address]          Radiology Studies: No results found.      Scheduled Meds: . Chlorhexidine Gluconate Cloth  6 each Topical Q0600  . cloNIDine  0.1 mg Oral QAC breakfast  . enoxaparin (LOVENOX) injection  40 mg Subcutaneous Q24H  . mupirocin ointment  1 application Nasal BID   Continuous Infusions: . cefTRIAXone (ROCEPHIN)  IV Stopped (10/28/17 1425)  . vancomycin Stopped (10/28/17 1548)     LOS: 6 days    Time spent: over 20 min    Lacretia Nicks, MD [facility] Pager [phone]  If 7PM-7AM, please contact night-coverage [email or web address] Password [facility] 10/29/2017, 10:13 AM

## 2017-10-30 ENCOUNTER — Inpatient Hospital Stay (HOSPITAL_COMMUNITY): Payer: Self-pay

## 2017-10-30 LAB — BASIC METABOLIC PANEL
Anion gap: 7 (ref 5–15)
BUN: 7 mg/dL (ref 6–20)
CHLORIDE: 108 mmol/L (ref 101–111)
CO2: 24 mmol/L (ref 22–32)
Calcium: 9 mg/dL (ref 8.9–10.3)
Creatinine, Ser: 0.68 mg/dL (ref 0.44–1.00)
GFR calc non Af Amer: >60 mL/min (ref >60)
Glucose, Bld: 89 mg/dL (ref 65–99)
POTASSIUM: 3.7 mmol/L (ref 3.5–5.1)
SODIUM: 139 mmol/L (ref 135–145)

## 2017-10-30 LAB — CBC
HCT: 36.6 % (ref 36.0–46.0)
HEMOGLOBIN: 12.1 g/dL (ref 12.0–15.0)
MCH: 27.8 pg (ref 26.0–34.0)
MCHC: 33.1 g/dL (ref 30.0–36.0)
MCV: 84.1 fL (ref 78.0–100.0)
Platelets: 244 10*3/uL (ref 150–400)
RBC: 4.35 MIL/uL (ref 3.87–5.11)
RDW: 14.2 % (ref 11.5–15.5)
WBC: 5.6 10*3/uL (ref 4.0–10.5)

## 2017-10-30 MED ORDER — LINEZOLID 600 MG PO TABS: 600.0000 mg | ORAL_TABLET | Freq: Two times a day (BID) | ORAL | 0 refills | Status: DC

## 2017-10-30 MED ORDER — SULFAMETHOXAZOLE-TRIMETHOPRIM 800-160 MG PO TABS: 1.0000 | ORAL_TABLET | Freq: Two times a day (BID) | ORAL | 0 refills | Status: AC

## 2017-10-30 NOTE — Discharge Summary (Signed)
Physician Discharge Summary  PHILIPPA VESSEY XBJ:[id number] DOB: [birth year] DOA: 10/23/2017  PCP: System, Provider Not In  Admit date: 10/23/2017 Discharge date: 10/30/2017  Time spent: over 30 minutes  Recommendations for Outpatient Follow-up:  1. Follow up outpatient CBC/CMP 2. Ensure completion of antibiotics and follow up with ID  3. Follow up polysubstance abuse, recommended follow up with provided resources and PCP 4. Follow up final TEE results 5. F/u pulm nodules   Discharge Diagnoses:  Principal Problem:   Septic embolism (HCC) Active Problems:   Opioid dependence (HCC)   Polysubstance abuse (HCC)   Cellulitis of left hand   Heart murmur   Cellulitis of right foot without toes   IVDU (intravenous drug user)   Normocytic anemia   Elevated transaminase level   Discharge Condition: stable  Diet recommendation: heart healthy  Filed Weights   10/23/17 2318 10/29/17 1046  Weight: 61.2 kg (135 lb) 61.2 kg (135 lb)    History of present illness:  41yow PMH IVDU presented with sore throat x2 weeks, left hand, left foot erythema and pain. Febrile and tachycardic in ED, treated with IV abx. Admitted for cellulitis left hand, RLE. Developed heroin withdrawal.Hand and foot cellulitis improving. CT chest showed lung nodules concerning for septic emboli.  TEE without vegetation.  Planned to discharge with 2 weeeks of linezolid, but unable to obtain this so discussed with ID who rec bactrim x 2 weeks.    Hospital Course:  Cellulitis left hand, right foot/ankle in context of IVDU. Plain films negative. Hepatitis and HIV screens negative. - [place] ordered today of R foot, with small abscess present.  Surgery saw patient and did not think it required surgery.      - plan for 2 weeks linezolid per ID, unclear etiology of pulm nodules.  Unfortunately, unable to get linezolid (care manager looked into coupon, etc and was going to try to get this from [facility], but not in stock and  unclear when it would come in).  Discussed with Dr. Ninetta Lights on day of discharge who recommended bactrim x 2 weeks.  [ ]  follow up full report of TEE (no vegetation to AV or MV)  Bilateral lung nodules concerning for septic emboli in context of IVDU - TTE without vegetation.  TEE without vegetation as well, but full report pending -continue linezolid.  Heart murmur (known history). Echo showed mild MR and mild-to-moderate TR. TEE showed trace MR and trivial TR.  Polysubstance abuse, IVDU with heroin withdrawal.  - On clonidine protocol.  - encouraged cessation and outpatient f/u  Elevated transaminases - stable/trending down. Etiology unclear, likely secondary to drug use.  Chronic normocytic anemia,stable  Chest pain - EKG nonacute. Troponin negative. CT chest negative for PE. Lipase WNL. Echo unremarkable. Suspect secondary to withdrawal and septic emboli  Low back pain. MRI lumbar spine negative. -continuesupportive care     Procedures: TTE 12/16 Impressions:  - Normal LV wall thickness with LVEF 55-60% and normal diastolic   function. Mildly thickened mitral leaflets with mild mitral   regurgitation. Upper normal left atrial chamber size. Mild to   moderate tricuspid regurgitation with PASP estimated 37 mmHg.   Evidence of increased CVP noted.  TEE 12/21 TEE Report  AV mildly thickened.  No obvious vegetation  MV normal  No vegetation  Trace MR  TV normal  Triv TR  PV normal    LVEF and RVEF normal  LA, LAA without masses  NO PFO by color doppler or with injection  of agitated saline  Normal thoracic aorta.    Full report to follow    Consultations:  ID  Cardiology  Discharge Exam: Vitals:   10/30/17 0507 10/30/17 1423  BP: 140/86 (!) 144/66  Pulse: (!) 58   Resp: 15 17  Temp: 98 F (36.7 C) 97.8 F (36.6 C)  SpO2: 99% 98%   Ready to go home.    General: No acute distress. Cardiovascular: Heart sounds show Leatta Alewine  regular rate, and rhythm. No gallops or rubs. No murmurs. No JVD. Lungs: Clear to auscultation bilaterally with good air movement. No rales, rhonchi or wheezes. Abdomen: Soft, nontender, nondistended with normal active bowel sounds. No masses. No hepatosplenomegaly. Neurological: Alert and oriented 3. Moves all extremities 4 with equal strength. Cranial nerves II through XII grossly intact. Extremities: Erythema to R foot, track marks. Pedal pulses 2+. Psychiatric: Mood and affect are normal. Insight and judgment are appropriate.   Discharge Instructions   Discharge Instructions    Call MD for:  difficulty breathing, headache or visual disturbances   Complete by:  As directed    Call MD for:  extreme fatigue   Complete by:  As directed    Call MD for:  persistant dizziness or light-headedness   Complete by:  As directed    Call MD for:  persistant nausea and vomiting   Complete by:  As directed    Call MD for:  redness, tenderness, or signs of infection (pain, swelling, redness, odor or green/yellow discharge around incision site)   Complete by:  As directed    Call MD for:  severe uncontrolled pain   Complete by:  As directed    Call MD for:  temperature >100.4   Complete by:  As directed    Diet - low sodium heart healthy   Complete by:  As directed    Discharge instructions   Complete by:  As directed    You were admitted for cellulitis.  Your CT scan looked like you had evidence of "septic emboli" which can be Fajr Fife complication of IV drug use.  Your blood cultures were negative and your echo did not have evidence of vegetation (bacteria on the valves), but we will still treat you with an extended course of antibiotics.  We initially planned linezolid, but because of the difficulty getting this, we'll plan on bactrim for 2 weeks (do not pick up the linezolid we previously prescribed).  Please start this tomorrow.  Please call and establish with Kiwana Deblasi primary care doctor.  They can help  follow up this hospitalization and also help with your sobriety.  Please follow up with the resources for substance abuse.  This is extremely important to prevent future complications.  Please follow up with Dr. Luciana Axeomer.  Please have your new PCP request records from this hospitalization so they know what was done.  Return if you have new, worsening, or recurrent symptoms.  Watch for signs of infection.   Increase activity slowly   Complete by:  As directed      Allergies as of 10/30/2017   No Known Allergies     Medication List    TAKE these medications   acetaminophen 325 MG tablet Commonly known as:  TYLENOL Take 2 tablets (650 mg total) by mouth every 6 (six) hours as needed for mild pain (or Fever >/= 101).   ibuprofen 800 MG tablet Commonly known as:  ADVIL,MOTRIN Take 1 tablet (800 mg total) by mouth 3 (three) times daily as  needed for moderate pain.   sulfamethoxazole-trimethoprim 800-160 MG tablet Commonly known as:  BACTRIM DS,SEPTRA DS Take 1 tablet by mouth 2 (two) times daily for 25 doses.      No Known Allergies Follow-up Information    [facility] Follow up.   Specialty:  Emergency Medicine Why:  As needed Contact information: [address] [id number] [place] [id number] [phone]       [facility]. Schedule an appointment as soon as possible for Hillard Goodwine visit in 1 week(s).   Why:  For reevaluation of symptoms Contact information: [address] [place] [phone]       Gardiner Barefootomer, Robert W, MD Follow up.   Specialty:  Infectious Diseases Why:  You have an appointment with Dr. Luciana Axeomer on January 8th, please call to confirm the time and date.  Contact information: [address] [place] [id number] [phone]            The results of significant diagnostics from this hospitalization (including imaging, microbiology,  ancillary and laboratory) are listed below for reference.    Significant Diagnostic Studies: Ct Angio Chest Pe W Or Wo Contrast  Result Date: 10/25/2017 CLINICAL DATA:  Poly substance abuse.  Fevers and chills. EXAM: CT ANGIOGRAPHY CHEST WITH CONTRAST TECHNIQUE: Multidetector CT imaging of the chest was performed using the standard protocol during bolus administration of intravenous contrast. Multiplanar CT image reconstructions and MIPs were obtained to evaluate the vascular anatomy. CONTRAST:  100mL ISOVUE-370 IOPAMIDOL (ISOVUE-370) INJECTION 76% COMPARISON:  None. FINDINGS: Cardiovascular: Satisfactory opacification of the pulmonary arteries to the segmental level. No evidence of pulmonary embolism. Normal heart size. No pericardial effusion. Mediastinum/Nodes: No enlarged mediastinal, hilar, or axillary lymph nodes. Thyroid gland, trachea, and esophagus demonstrate no significant findings. Lungs/Pleura: Small bilateral pleural effusions. No pneumothorax. Bilateral interstitial thickening. Small subcentimeter nodules of the periphery of the left upper lobe and left lower lobe. Small nodular airspace disease in the right lower lobe and to lesser extent right upper lobe. No areas of cavitation. Upper Abdomen: No acute abnormality. Musculoskeletal: No acute osseous abnormality. No aggressive osseous lesion. Review of the MIP images confirms the above findings. IMPRESSION: 1. No evidence of pulmonary embolus. 2. Bilateral interstitial thickening with small subcentimeter nodules peripherally in the left upper lobe, left lower lobe, rib right lower lobe and to lesser extent right upper lobe concerning for an infectious or inflammatory etiology including septic emboli. Electronically Signed   By: Elige KoHetal  Patel   On: 10/25/2017 16:52   Mr Lumbar Spine Wo Contrast  Result Date: 10/24/2017 CLINICAL DATA:  Back pain for several days. EXAM: MRI LUMBAR SPINE WITHOUT CONTRAST TECHNIQUE: Multiplanar, multisequence MR  imaging of the lumbar spine was performed. No intravenous contrast was administered. COMPARISON:  None. FINDINGS: Segmentation: 5 lumbar type vertebral bodies. The last full intervertebral disc space is labeled L5-S1. Alignment:  Normal Vertebrae:  Normal marrow signal.  No bone lesions or fracture. Conus medullaris and cauda equina: Conus extends to the L1 level. Conus and cauda equina appear normal. Paraspinal and other soft tissues: No significant findings. Disc levels: No disc protrusions, spinal or foraminal stenosis. No significant facet disease or pars defects. IMPRESSION: Unremarkable lumbar spine MRI examination. Electronically Signed   By: Rudie MeyerP.  Gallerani M.D.   On: 10/24/2017 11:35   Dg Chest Port 1 View  Result Date: 10/25/2017 CLINICAL DATA:  New onset of mid chest pain this morning. EXAM: PORTABLE  CHEST 1 VIEW COMPARISON:  06/09/2004 FINDINGS: There is Samara Stankowski focal area of atelectasis is slightly infiltrate at the left lung base. The patient had Cristel Rail similar area of infiltrate on the prior exam and this could represent scarring or recurrent pneumonia. The lungs are otherwise clear. Heart size and vascularity are normal. Bones are normal. IMPRESSION: Focal area of infiltrate/ atelectasis and/or scarring at the left lung base. Electronically Signed   By: Francene Boyers M.D.   On: 10/25/2017 08:56   Dg Hand Complete Left  Result Date: 10/23/2017 CLINICAL DATA:  IV drug use.  Cellulitis EXAM: LEFT HAND - COMPLETE 3+ VIEW COMPARISON:  None. FINDINGS: Soft tissue swelling along the dorsum of the hand. No underlying bony abnormality. No radiographic changes of osteomyelitis. No radiopaque foreign bodies. IMPRESSION: No acute bony abnormality. Electronically Signed   By: Charlett Nose M.D.   On: 10/23/2017 19:46   Dg Foot Complete Right  Result Date: 10/23/2017 CLINICAL DATA:  Cellulitis, IV drug use in the foot last night. Swelling. EXAM: RIGHT FOOT COMPLETE - 3+ VIEW COMPARISON:  None. FINDINGS: There is  no evidence of fracture or dislocation. There is no evidence of arthropathy or other focal bone abnormality. No bony destructive change. Dorsal soft tissue edema. No soft tissue air. No radiopaque foreign body. IMPRESSION: Soft tissue edema without osseous abnormality or radiopaque foreign body. Electronically Signed   By: Rubye Oaks M.D.   On: 10/23/2017 19:46   [place] Rt Lower Extrem Ltd Soft Tissue Non Vascular  Result Date: 10/30/2017 CLINICAL DATA:  41 year old female with right foot redness and Maryjayne Kleven small lump. Recent self injection at this location. EXAM: ULTRASOUND RIGHT LOWER EXTREMITY LIMITED TECHNIQUE: Ultrasound examination of the lower extremity soft tissues was performed in the area of clinical concern. COMPARISON:  None. FINDINGS: Sonographic interrogation of the region of clinical concern demonstrates safe focal subcutaneous hypoechoic collection measuring 1.6 x 2.2 x 0.5 cm. The sonographic features are most consistent with complex fluid. There is some surrounding hypervascularity consistent with inflammation. IMPRESSION: Subcutaneous complex 1.6 x 2.2 x 0.5 cm (volume = 0.9 mL) fluid collection most consistent with Neyla Gauntt very small abscess. Electronically Signed   By: Malachy Moan M.D.   On: 10/30/2017 16:21    Microbiology: Recent Results (from the past 240 hour(s))  Blood culture (routine x 2)     Status: None   Collection Time: 10/23/17  6:05 PM  Result Value Ref Range Status   Specimen Description BLOOD BLOOD RIGHT FOREARM  Final   Special Requests   Final    BOTTLES DRAWN AEROBIC AND ANAEROBIC Blood Culture adequate volume   Culture   Final    NO GROWTH 5 DAYS Performed at [facility] Lab, [address] [place], [place] [address]    Report Status 10/29/2017 FINAL  Final  Blood culture (routine x 2)     Status: None   Collection Time: 10/23/17 10:12 PM  Result Value Ref Range Status   Specimen Description BLOOD RIGHT HAND  Final   Special Requests IN PEDIATRIC  BOTTLE Blood Culture adequate volume  Final   Culture   Final    NO GROWTH 5 DAYS Performed at [facility], [address] [place], [place] [address]    Report Status 10/29/2017 FINAL  Final  Culture, blood (routine x 2)     Status: None (Preliminary result)   Collection Time: 10/26/17  2:29 PM  Result Value Ref Range Status   Specimen Description BLOOD  LEFT HAND  Final   Special Requests IN PEDIATRIC BOTTLE Blood Culture adequate volume  Final   Culture   Final    NO GROWTH 4 DAYS Performed at [facility], [address], [place], [place] [address]    Report Status PENDING  Incomplete  Culture, blood (routine x 2)     Status: None (Preliminary result)   Collection Time: 10/26/17  2:41 PM  Result Value Ref Range Status   Specimen Description BLOOD LEFT HAND  Final   Special Requests IN PEDIATRIC BOTTLE Blood Culture adequate volume  Final   Culture   Final    NO GROWTH 4 DAYS Performed at [facility] Lab, [address], [place], [place] [address]    Report Status PENDING  Incomplete  MRSA PCR Screening     Status: Abnormal   Collection Time: 10/27/17  8:31 AM  Result Value Ref Range Status   MRSA by PCR POSITIVE (Ainsleigh Kakos) NEGATIVE Final    Comment:        The GeneXpert MRSA Assay (FDA approved for NASAL specimens only), is one component of Dasha Kawabata comprehensive MRSA colonization surveillance program. It is not intended to diagnose MRSA infection nor to guide or monitor treatment for MRSA infections. RESULT CALLED TO, READ BACK BY AND VERIFIED WITH: R.GREENAWALT AT 1446 ON 10/27/17 BY N.THOMPSON      Labs: Basic Metabolic Panel: Recent Labs  Lab 10/23/17 2212 10/24/17 0981 10/25/17 0918 10/26/17 0533 10/29/17 0628 10/30/17 0621  NA  --  141 141 143 139 139  K  --  3.3* 4.1 4.0 3.6 3.7  CL  --  112* 115* 117* 107 108  CO2  --  24 21* 22 24 24   GLUCOSE  --  217* 135* 94 88 89  BUN  --  13 14 9 9 7   CREATININE  --  0.56 0.58 0.66 0.66 0.68  CALCIUM  --   8.5* 8.3* 8.6* 9.0 9.0  MG 1.9  --   --   --   --   --    Liver Function Tests: Recent Labs  Lab 10/24/17 0632 10/25/17 0918 10/26/17 0533 10/29/17 0628  AST 83* 83* 44* 18  ALT 57* 67* 58* 31  ALKPHOS 68 71 73 77  BILITOT 0.9 0.7 0.8 0.7  PROT 6.1* 5.4* 5.7* 6.7  ALBUMIN 3.0* 2.5* 2.7* 3.2*   Recent Labs  Lab 10/25/17 0918  LIPASE 29   No results for input(s): AMMONIA in the last 168 hours. CBC: Recent Labs  Lab 10/24/17 0632 10/25/17 1437 10/26/17 0533 10/29/17 0628 10/30/17 0621  WBC 4.7 8.3 8.3 5.3 5.6  NEUTROABS 3.9  --   --   --   --   HGB 9.5* 10.2* 10.8* 12.3 12.1  HCT 29.4* 31.7* 33.3* 37.2 36.6  MCV 85.5 86.4 86.0 83.8 84.1  PLT 102* 196 184 234 244   Cardiac Enzymes: Recent Labs  Lab 10/25/17 0918  TROPONINI <0.03   BNP: BNP (last 3 results) No results for input(s): BNP in the last 8760 hours.  ProBNP (last 3 results) No results for input(s): PROBNP in the last 8760 hours.  CBG: No results for input(s): GLUCAP in the last 168 hours.     Signed:  Lacretia Nicks MD.  [facility] 10/30/2017, 8:51 PM

## 2017-10-30 NOTE — Progress Notes (Signed)
PROGRESS NOTE    Darden Datesriscilla K Siebels  RUE:[id number]:[id number] DOB: 11/21/1975 DOA: 10/23/2017 PCP: System, Provider Not In   Brief Narrative: 41yow PMH IVDU presented with sore throat x2 weeks, left hand, left foot erythema and pain. Febrile and tachycardic in ED, treated with IV abx. Admitted for cellulitis left hand, RLE. Developed heroin withdrawal. Hand and foot cellulitis improving. CT chest showed lung nodules concerning for septic emboli. ID recommended TEE and will make recs based on study.   Assessment & Plan:   Principal Problem:   Septic embolism (HCC) Active Problems:   Opioid dependence (HCC)   Polysubstance abuse (HCC)   Cellulitis of left hand   Heart murmur   Cellulitis of right foot without toes   IVDU (intravenous drug user)   Normocytic anemia   Elevated transaminase level  Cellulitis left hand, right foot/ankle in context of IVDU. Plain films negative. Hepatitis and HIV screens negative. - US ordered today, with prelim read per nurse with abscess, discussed with surgery who will come by to see if amenable to I&D - plan for 2 weeks linezolid per ID, unclear etiology of pulm nodules [ ]  follow up full report of TEE (no vegetation to AV or MV)  Bilateral lung nodules concerning for septic emboli in context of IVDU - TTE without vegetation.  TEE without vegetation as well, but full report pending - continue linezolid.  Heart murmur (known history). Echo showed mild MR and mild-to-moderate TR. TEE showed trace MR and trivial TR.  Polysubstance abuse, IVDU with heroin withdrawal.  - On clonidine protocol.   Elevated transaminases - stable/trending down. Etiology unclear, likely secondary to drug use.  Chronic normocytic anemia, stable  Chest pain - EKG nonacute. Troponin negative. CT chest negative for PE. Lipase WNL. Echo unremarkable. Suspect secondary to withdrawal and septic emboli  Low back pain. MRI lumbar spine negative. - continue supportive  care  DVT prophylaxis: lovenox Code Status: full  Family Communication: none at bedside Disposition Plan: pending   Consultants:   ID  Cardiology  Procedures: (Don't include imaging studies which can be auto populated. Include things that cannot be auto populated i.e. Echo, Carotid and venous dopplers, Foley, Bipap, HD, tubes/drains, wound vac, central lines etc) Echo 12/16 Impressions:  - Normal LV wall thickness with LVEF 55-60% and normal diastolic   function. Mildly thickened mitral leaflets with mild mitral   regurgitation. Upper normal left atrial chamber size. Mild to   moderate tricuspid regurgitation with PASP estimated 37 mmHg.   Evidence of increased CVP noted  12/17 LE US Final Interpretation Right: There is no evidence of deep vein thrombosis in the lower extremity.There is no evidence of superficial venous thrombosis. No cystic structure found in the popliteal fossa. Left: There is no evidence of deep vein thrombosis in the lower extremity.There is no evidence of superficial venous thrombosis. No cystic structure found in the popliteal fossa.  Antimicrobials: (specify start and planned stop date. Auto populated tables are space occupying and do not give end dates)  Zosyn 12/15-12/17  Vancomycin 12/15 -   Ceftriaxone 12/17 -     Subjective: Some persistent pain to R foot.   Objective: Vitals:   10/29/17 1336 10/29/17 2004 10/30/17 0507 10/30/17 1423  BP: 140/88 128/76 140/86 (!) 144/66  Pulse: (!) 56 60 (!) 58   Resp: 15 16 15 17   Temp: 98.1 F (36.7 C) 98.6 F (37 C) 98 F (36.7 C) 97.8 F (36.6 C)  TempSrc: Oral Oral Oral Oral  SpO2: 99% 98% 99% 98%  Weight:      Height:        Intake/Output Summary (Last 24 hours) at 10/30/2017 1615 Last data filed at 10/30/2017 0800 Gross per 24 hour  Intake 240 ml  Output -  Net 240 ml   Filed Weights   10/23/17 2318 10/29/17 1046  Weight: 61.2 kg (135 lb) 61.2 kg (135 lb)     Examination:  General: No acute distress. Cardiovascular: Heart sounds show Trong Gosling regular rate, and rhythm. No gallops or rubs. No murmurs. No JVD. Lungs: Clear to auscultation bilaterally with good air movement. No rales, rhonchi or wheezes. Abdomen: Soft, nontender, nondistended with normal active bowel sounds. No masses. No hepatosplenomegaly. Neurological: Alert and oriented 3. Moves all extremities 4 with equal strength. Cranial nerves II through XII grossly intact. Skin: erythema and fluctuance to R foot.  Extremities: No clubbing or cyanosis. No edema.  Psychiatric: Mood and affect are normal. Insight and judgment are appropriate.    Data Reviewed: I have personally reviewed following labs and imaging studies  CBC: Recent Labs  Lab 10/23/17 1805 10/24/17 1610 10/25/17 1437 10/26/17 0533 10/29/17 0628 10/30/17 0621  WBC 10.5 4.7 8.3 8.3 5.3 5.6  NEUTROABS 9.2* 3.9  --   --   --   --   HGB 10.9* 9.5* 10.2* 10.8* 12.3 12.1  HCT 32.4* 29.4* 31.7* 33.3* 37.2 36.6  MCV 83.9 85.5 86.4 86.0 83.8 84.1  PLT 175 102* 196 184 234 244   Basic Metabolic Panel: Recent Labs  Lab 10/23/17 2212 10/24/17 0632 10/25/17 0918 10/26/17 0533 10/29/17 0628 10/30/17 0621  NA  --  141 141 143 139 139  K  --  3.3* 4.1 4.0 3.6 3.7  CL  --  112* 115* 117* 107 108  CO2  --  24 21* 22 24 24   GLUCOSE  --  217* 135* 94 88 89  BUN  --  13 14 9 9 7   CREATININE  --  0.56 0.58 0.66 0.66 0.68  CALCIUM  --  8.5* 8.3* 8.6* 9.0 9.0  MG 1.9  --   --   --   --   --    GFR: Estimated Creatinine Clearance: 77.2 mL/min (by C-G formula based on SCr of 0.68 mg/dL). Liver Function Tests: Recent Labs  Lab 10/24/17 9604 10/25/17 0918 10/26/17 0533 10/29/17 0628  AST 83* 83* 44* 18  ALT 57* 67* 58* 31  ALKPHOS 68 71 73 77  BILITOT 0.9 0.7 0.8 0.7  PROT 6.1* 5.4* 5.7* 6.7  ALBUMIN 3.0* 2.5* 2.7* 3.2*   Recent Labs  Lab 10/25/17 0918  LIPASE 29   No results for input(s): AMMONIA in the  last 168 hours. Coagulation Profile: Recent Labs  Lab 10/29/17 0628  INR 1.00   Cardiac Enzymes: Recent Labs  Lab 10/25/17 0918  TROPONINI <0.03   BNP (last 3 results) No results for input(s): PROBNP in the last 8760 hours. HbA1C: No results for input(s): HGBA1C in the last 72 hours. CBG: No results for input(s): GLUCAP in the last 168 hours. Lipid Profile: No results for input(s): CHOL, HDL, LDLCALC, TRIG, CHOLHDL, LDLDIRECT in the last 72 hours. Thyroid Function Tests: No results for input(s): TSH, T4TOTAL, FREET4, T3FREE, THYROIDAB in the last 72 hours. Anemia Panel: No results for input(s): VITAMINB12, FOLATE, FERRITIN, TIBC, IRON, RETICCTPCT in the last 72 hours. Sepsis Labs: Recent Labs  Lab 10/23/17 1906 10/23/17 2019  LATICACIDVEN 1.06 0.52    Recent Results (  from the past 240 hour(s))  Blood culture (routine x 2)     Status: None   Collection Time: 10/23/17  6:05 PM  Result Value Ref Range Status   Specimen Description BLOOD BLOOD RIGHT FOREARM  Final   Special Requests   Final    BOTTLES DRAWN AEROBIC AND ANAEROBIC Blood Culture adequate volume   Culture   Final    NO GROWTH 5 DAYS Performed at [facility], [address] [place], [place] [address]    Report Status 10/29/2017 FINAL  Final  Blood culture (routine x 2)     Status: None   Collection Time: 10/23/17 10:12 PM  Result Value Ref Range Status   Specimen Description BLOOD RIGHT HAND  Final   Special Requests IN PEDIATRIC BOTTLE Blood Culture adequate volume  Final   Culture   Final    NO GROWTH 5 DAYS Performed at [facility], [address] [place], [place] [address]    Report Status 10/29/2017 FINAL  Final  Culture, blood (routine x 2)     Status: None (Preliminary result)   Collection Time: 10/26/17  2:29 PM  Result Value Ref Range Status   Specimen Description BLOOD LEFT HAND  Final   Special Requests IN PEDIATRIC BOTTLE Blood Culture adequate volume  Final   Culture    Final    NO GROWTH 4 DAYS Performed at [facility], [address] [place], [place] [address]    Report Status PENDING  Incomplete  Culture, blood (routine x 2)     Status: None (Preliminary result)   Collection Time: 10/26/17  2:41 PM  Result Value Ref Range Status   Specimen Description BLOOD LEFT HAND  Final   Special Requests IN PEDIATRIC BOTTLE Blood Culture adequate volume  Final   Culture   Final    NO GROWTH 4 DAYS Performed at [facility], [address] [place], [place] [address]    Report Status PENDING  Incomplete  MRSA PCR Screening     Status: Abnormal   Collection Time: 10/27/17  8:31 AM  Result Value Ref Range Status   MRSA by PCR POSITIVE (Aviela Blundell) NEGATIVE Final    Comment:        The GeneXpert MRSA Assay (FDA approved for NASAL specimens only), is one component of [facility] comprehensive MRSA colonization surveillance program. It is not intended to diagnose MRSA infection nor to guide or monitor treatment for MRSA infections. RESULT CALLED TO, READ BACK BY AND VERIFIED WITH: R.GREENAWALT AT 1446 ON 10/27/17 BY N.THOMPSON          Radiology Studies: No results found.      Scheduled Meds: . Chlorhexidine Gluconate Cloth  6 each Topical Q0600  . enoxaparin (LOVENOX) injection  40 mg Subcutaneous Q24H  . linezolid  600 mg Oral Q12H  . mupirocin ointment  1 application Nasal BID   Continuous Infusions:    LOS: 7 days    Time spent: over 20 min    Lacretia Nicksaldwell Powell, MD [facility] Pager [phone]  If 7PM-7AM, please contact night-coverage [email or web address] Password [facility] 10/30/2017, 4:15 PM

## 2017-10-30 NOTE — Consult Note (Signed)
Surgical Consultation Requesting provider: Dr. Lacretia Nicksaldwell Powell  CC: foot infection  HPI: 41 year old woman with history of IV drug abuse who presented to the hospital with a sore throat, left hand and left foot erythema and pain. She was admitted with sepsis related to multiple septic emboli from IV drug abuse. She states that she did inject heroin into her left right foot. She underwent her withdrawal while in the hospital. Her soft tissue infections were treated with antibiotics as well as septic emboli in her lungs. She has gradually continued to recover but has a persistent cystic lesion on the dorsum of her right foot. This is been ultrasounded today and does contain some fluid.  No Known Allergies  Past Medical History:  Diagnosis Date  . Depression   . Drug use   . Heart murmur   . Hypertension   . Opioid dependence (HCC)     Past Surgical History:  Procedure Laterality Date  . TUBAL LIGATION      Family History  Problem Relation Age of Onset  . Bipolar disorder Mother   . COPD Mother   . Heart disease Father     Social History   Socioeconomic History  . Marital status: Divorced    Spouse name: None  . Number of children: None  . Years of education: None  . Highest education level: None  Social Needs  . Financial resource strain: None  . Food insecurity - worry: None  . Food insecurity - inability: None  . Transportation needs - medical: None  . Transportation needs - non-medical: None  Occupational History  . None  Tobacco Use  . Smoking status: Former Games developermoker  . Smokeless tobacco: Never Used  Substance and Sexual Activity  . Alcohol use: No  . Drug use: Yes    Types: Cocaine, Heroin, Other-see comments, Marijuana    Comment: Heroin  . Sexual activity: Yes    Birth control/protection: Surgical  Other Topics Concern  . None  Social History Narrative  . None    No current facility-administered medications on file prior to encounter.    Current  Outpatient Medications on File Prior to Encounter  Medication Sig Dispense Refill  . acetaminophen (TYLENOL) 325 MG tablet Take 2 tablets (650 mg total) by mouth every 6 (six) hours as needed for mild pain (or Fever >/= 101).    Marland Kitchen. ibuprofen (ADVIL,MOTRIN) 800 MG tablet Take 1 tablet (800 mg total) by mouth 3 (three) times daily as needed for moderate pain. 30 tablet 0    Review of Systems: a complete, 10pt review of systems was completed with pertinent positives and negatives as documented in the HPI  Physical Exam: Vitals:   10/30/17 0507 10/30/17 1423  BP: 140/86 (!) 144/66  Pulse: (!) 58   Resp: 15 17  Temp: 98 F (36.7 C) 97.8 F (36.6 C)  SpO2: 99% 98%   Gen: A&Ox3, no distress  Head: normocephalic, atraumatic, EOMI, anicteric.  Neck: supple without mass or thyromegaly Chest: unlabored respirations, symmetrical air entry   Cardiovascular: RRR with palpable distal pulses, no pedal edema Abdomen: soft, nondistended, nontender. No mass or organomegaly.  Extremities: warm, without edema, no deformities Neuro: grossly intact Psych: appropriate mood and affect  Skin: warm and dry. There is a 1 cm cystic subcutaneous lesion on the dorsum of the right foot. There is not really any surrounding erythema induration or warmth and it is not tender.   CBC Latest Ref Rng & Units 10/30/2017 10/29/2017 10/26/2017  WBC 4.0 - 10.5 K/uL 5.6 5.3 8.3  Hemoglobin 12.0 - 15.0 g/dL 91.412.1 78.212.3 10.8(L)  Hematocrit 36.0 - 46.0 % 36.6 37.2 33.3(L)  Platelets 150 - 400 K/uL 244 234 184    CMP Latest Ref Rng & Units 10/30/2017 10/29/2017 10/26/2017  Glucose 65 - 99 mg/dL 89 88 94  BUN 6 - 20 mg/dL 7 9 9   Creatinine 0.44 - 1.00 mg/dL 9.560.68 2.130.66 0.860.66  Sodium 135 - 145 mmol/L 139 139 143  Potassium 3.5 - 5.1 mmol/L 3.7 3.6 4.0  Chloride 101 - 111 mmol/L 108 107 117(H)  CO2 22 - 32 mmol/L 24 24 22   Calcium 8.9 - 10.3 mg/dL 9.0 9.0 5.7(Q8.6(L)  Total Protein 6.5 - 8.1 g/dL - 6.7 5.7(L)  Total Bilirubin 0.3  - 1.2 mg/dL - 0.7 0.8  Alkaline Phos 38 - 126 U/L - 77 73  AST 15 - 41 U/L - 18 44(H)  ALT 14 - 54 U/L - 31 58(H)    Lab Results  Component Value Date   INR 1.00 10/29/2017    Imaging: [place] Rt Lower Extrem Ltd Soft Tissue Non Vascular  Result Date: 10/30/2017 CLINICAL DATA:  41 year old female with right foot redness and a small lump. Recent self injection at this location. EXAM: ULTRASOUND RIGHT LOWER EXTREMITY LIMITED TECHNIQUE: Ultrasound examination of the lower extremity soft tissues was performed in the area of clinical concern. COMPARISON:  None. FINDINGS: Sonographic interrogation of the region of clinical concern demonstrates safe focal subcutaneous hypoechoic collection measuring 1.6 x 2.2 x 0.5 cm. The sonographic features are most consistent with complex fluid. There is some surrounding hypervascularity consistent with inflammation. IMPRESSION: Subcutaneous complex 1.6 x 2.2 x 0.5 cm (volume = 0.9 mL) fluid collection most consistent with a very small abscess. Electronically Signed   By: Malachy MoanHeath  McCullough M.D.   On: 10/30/2017 16:21      A/P:  41 year old woman with history of polysubstance abuse and admission for multiple areas of infection. The cyst on the dorsum of her foot certainly does contain some fluid but does not seem to have any residual signs of infection. It is very small and does not require surgical debridement. I advised her that if it becomes larger, more painful or red that it may require bedside I&D. She expressed understanding and will continue to monitor. General surgery will sign off.   Phylliss Blakeshelsea Tene Gato, MD [facility], [place] Pager [phone]

## 2017-10-30 NOTE — Care Management Note (Addendum)
Case Management Note  Patient Details  Name: Margaret [name] MRN: [id number] Date of Birth: April 27, 1976  Subjective/Objective:    Cellulitis, pulmonary nodules                Action/Plan: Discharge Planning: NCM spoke to pt and she is currently not working, provided pt with Goodrx coupon for [facility] for $96 for Zyvox or manufacture coupon were cost can be only $1 for medication. Provided pt with brochure for [facility] to call next week to schedule follow up appt. Will need a printed Rx for Zyvox. [facility] [place] and they do not have in stock. Pharmacy will have to order. Sent [facility] to [facility] [address], they do have in stock but it will be $1000. [facility]ed [facility] her [facility] to request they order.    Expected Discharge Date:               Expected Discharge Plan:  Home/Self Care  In-House Referral:  NA  Discharge planning Services  CM Consult, Medication Assistance, Indigent Health Clinic  Post Acute Care Choice:  NA Choice offered to:  Patient  DME Arranged:  N/A DME Agency:  NA  HH Arranged:  NA HH Agency:  NA  Status of Service:  Completed, signed off  If discussed at Long Length of Stay Meetings, dates discussed:    Additional Comments:  Elliot CousinShavis, Alanna Storti Ellen, RN 10/30/2017, 2:53 PM

## 2017-10-31 ENCOUNTER — Encounter (HOSPITAL_COMMUNITY): Payer: Self-pay | Admitting: Internal Medicine

## 2017-10-31 LAB — CULTURE, BLOOD (ROUTINE X 2)
CULTURE: NO GROWTH
Culture: NO GROWTH
SPECIAL REQUESTS: ADEQUATE
Special Requests: ADEQUATE

## 2017-11-16 ENCOUNTER — Ambulatory Visit: Payer: Self-pay | Admitting: Internal Medicine

## 2019-01-31 IMAGING — CR DG HAND COMPLETE 3+V*L*
3 series · 3 of 3 positions shown · non-contrast
Comparison: None.

CLINICAL DATA: IV drug use.  Cellulitis

EXAM:
LEFT HAND - COMPLETE 3+ VIEW

[x hand pa left]
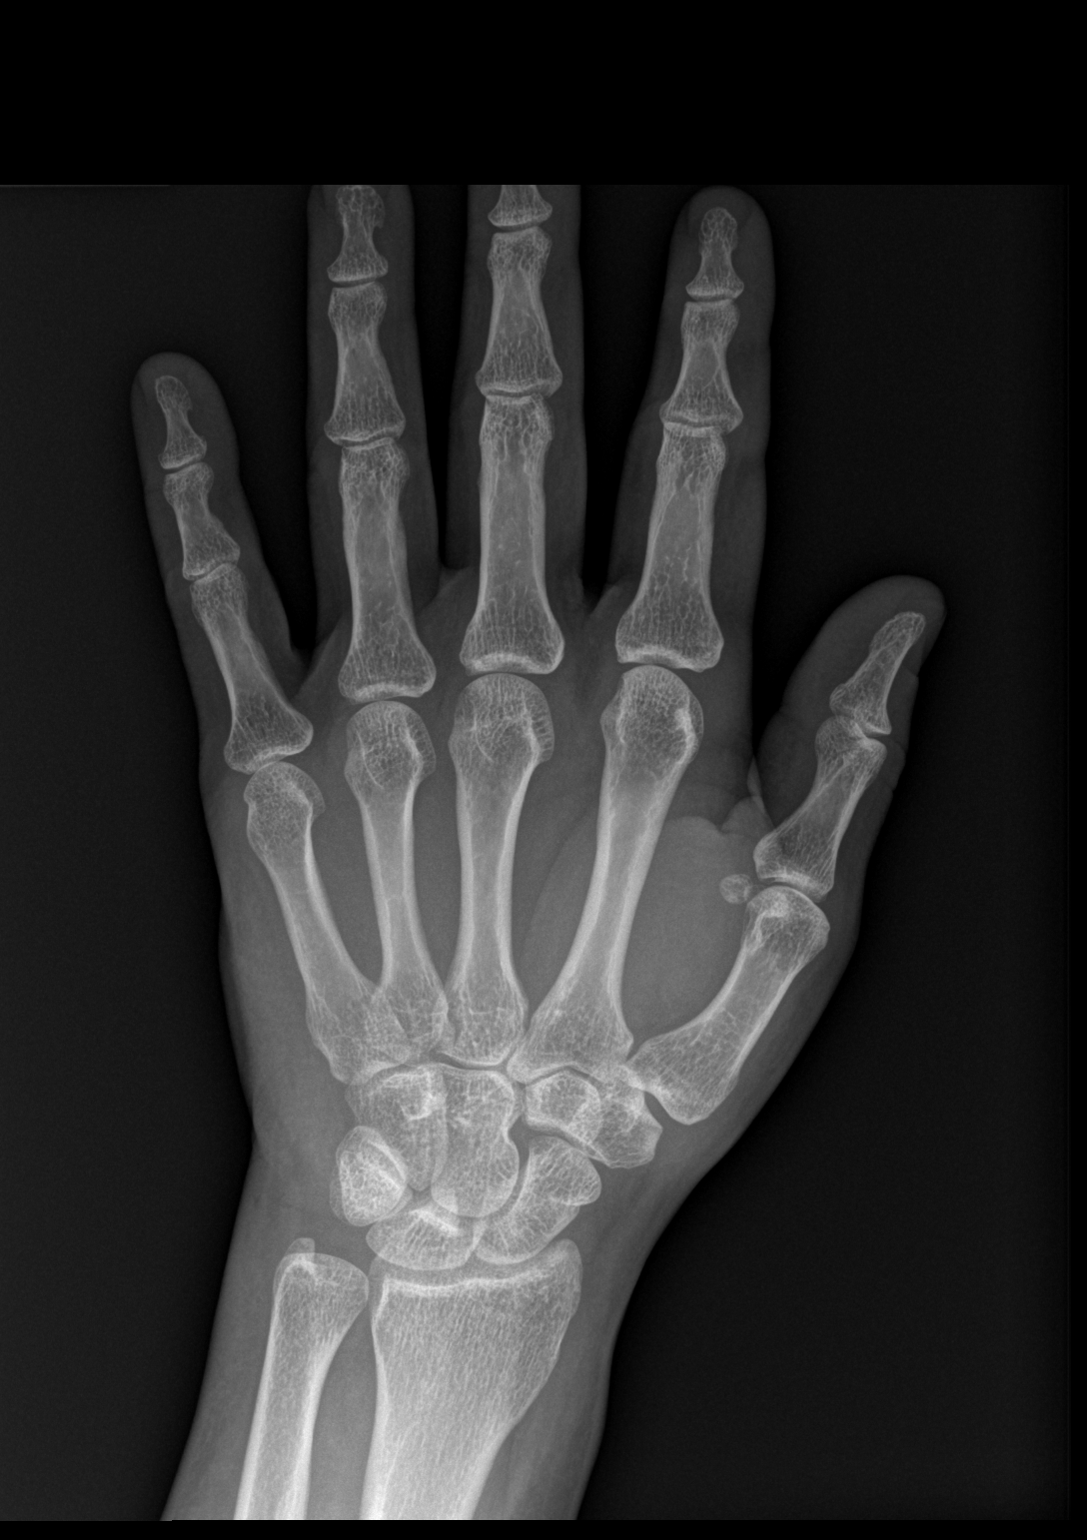

[x hand obl left]
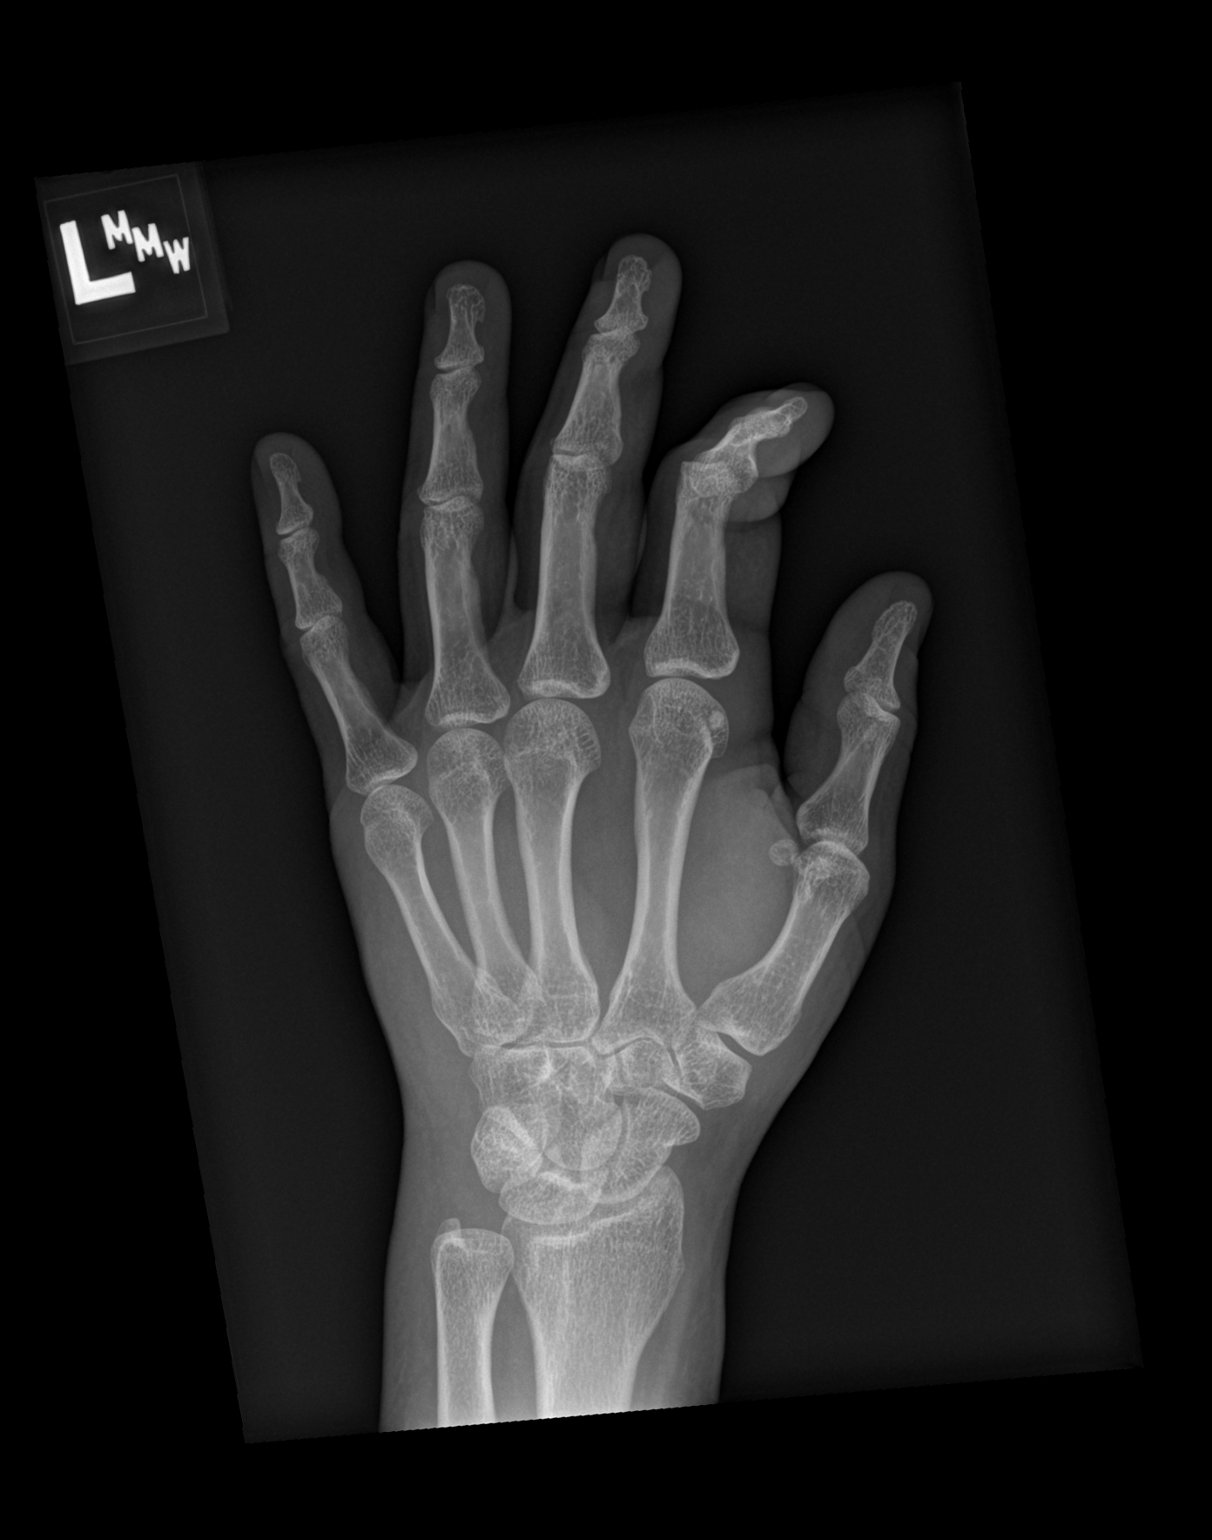

[x hand lat left]
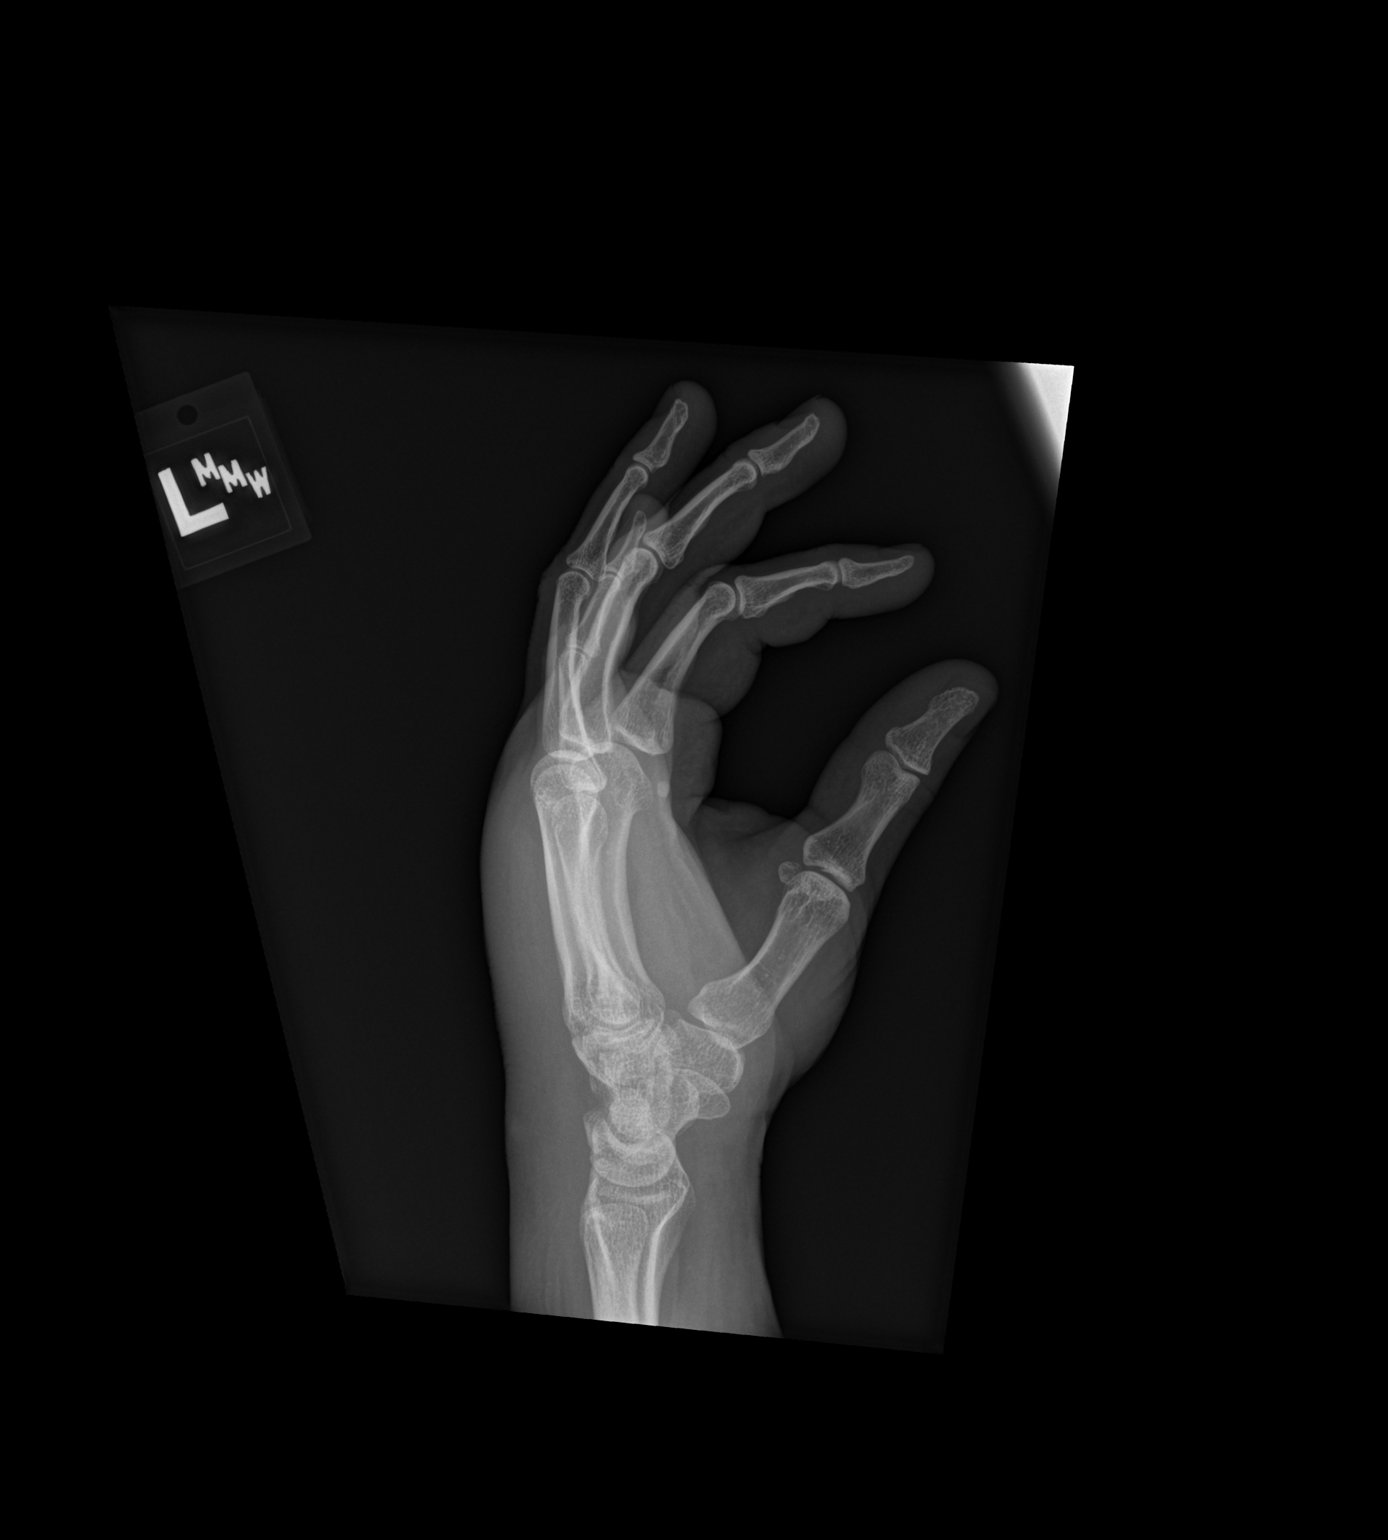

[3 of 3 positions shown; findings below may reference images not displayed]

FINDINGS: Soft tissue swelling along the dorsum of the hand. No underlying
bony abnormality. No radiographic changes of osteomyelitis. No
radiopaque foreign bodies.
IMPRESSION: No acute bony abnormality.

## 2019-02-02 IMAGING — DX DG CHEST 1V PORT
1 series · 1 of 1 positions shown · non-contrast
Comparison: 06/09/2004

CLINICAL DATA: New onset of mid chest pain this morning.

EXAM:
PORTABLE CHEST 1 VIEW

[chest ap]
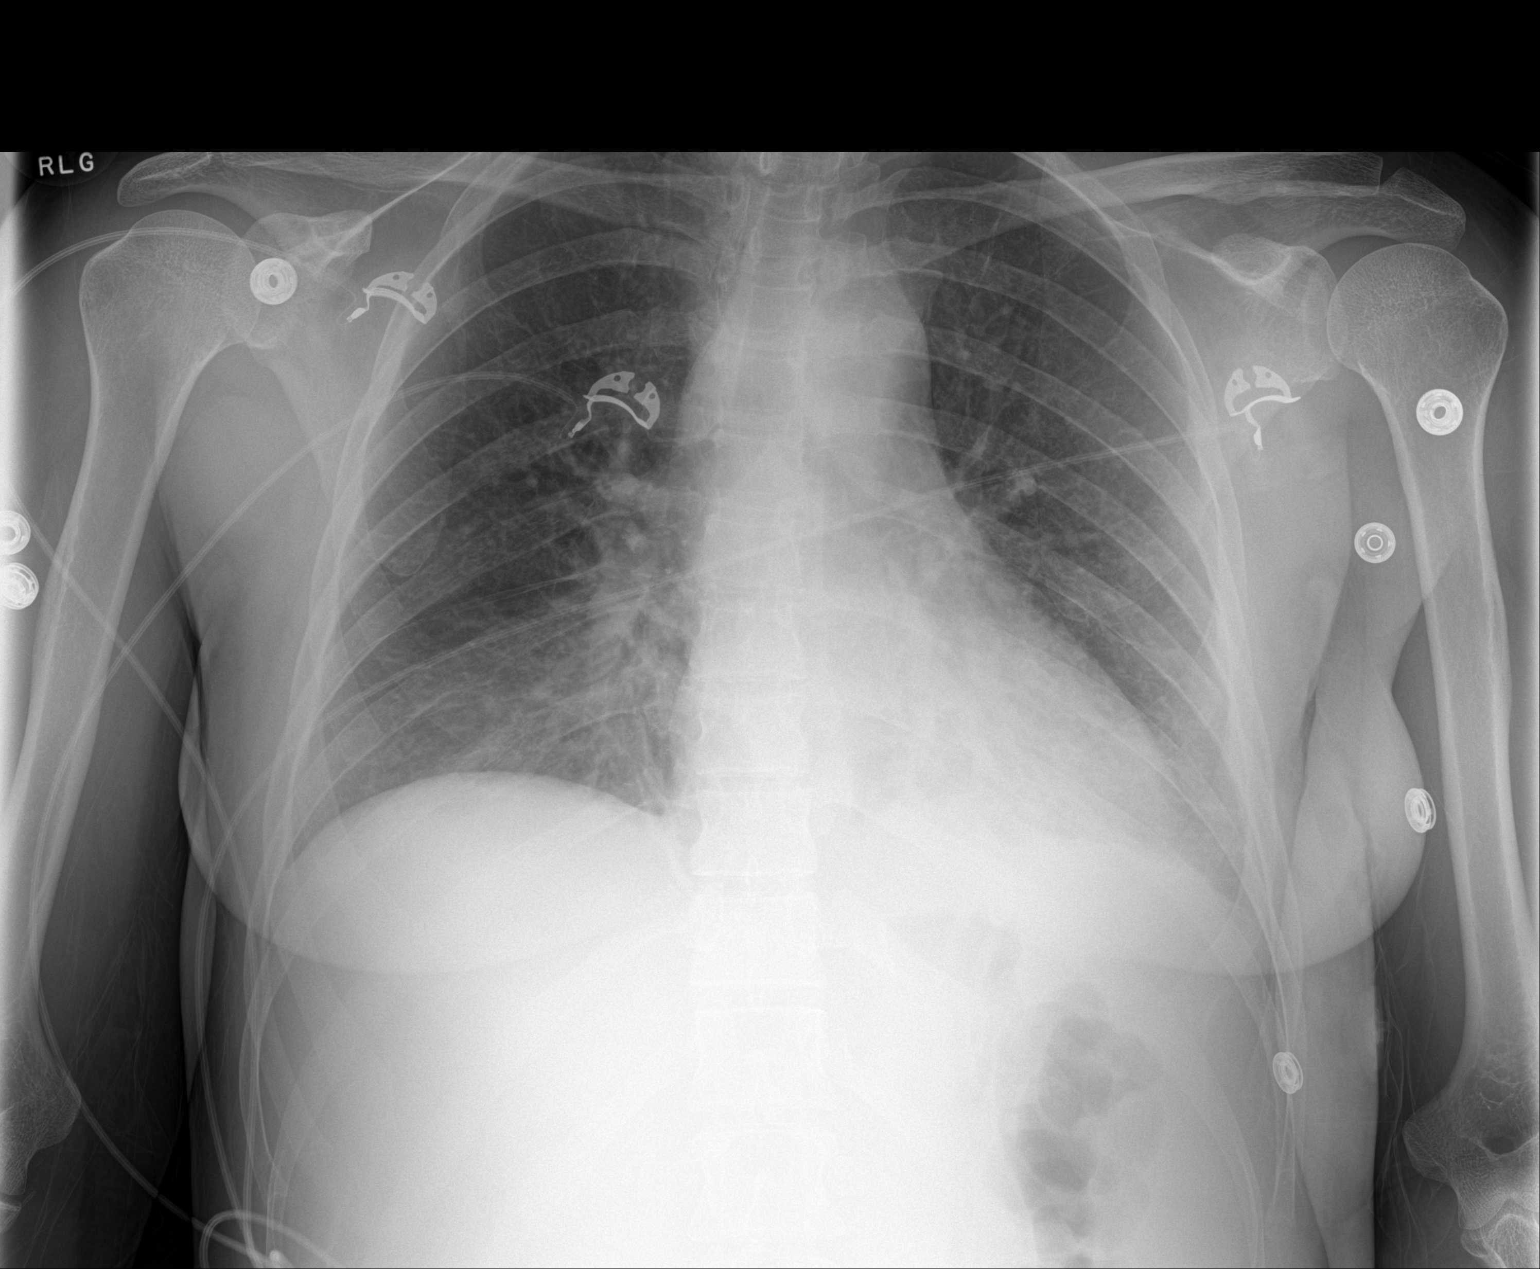

[1 of 1 positions shown; findings below may reference images not displayed]

FINDINGS: There is a focal area of atelectasis is slightly infiltrate at the
left lung base. The patient had a similar area of infiltrate on the
prior exam and this could represent scarring or recurrent pneumonia.

The lungs are otherwise clear. Heart size and vascularity are
normal. Bones are normal.
IMPRESSION: Focal area of infiltrate/ atelectasis and/or scarring at the left
lung base.
